# Patient Record
Sex: Male | Born: 1945 | Race: White | Hispanic: No | Smoking: Never smoker
Health system: Southern US, Community
[De-identification: ages and names within clinical notes are randomized; demographics above are authoritative.]

## PROBLEM LIST (undated history)

## (undated) DIAGNOSIS — I2510 Atherosclerotic heart disease of native coronary artery without angina pectoris: Secondary | ICD-10-CM

---

## 2017-01-02 ENCOUNTER — Emergency Department (HOSPITAL_COMMUNITY): Payer: Medicare HMO

## 2017-01-02 ENCOUNTER — Inpatient Hospital Stay (HOSPITAL_COMMUNITY)
Admission: EM | Admit: 2017-01-02 | Discharge: 2017-01-15 | DRG: 228 | Disposition: A | Discharge status: Home / Self Care | Payer: Medicare HMO | Attending: Surgery | Admitting: Surgery

## 2017-01-02 ENCOUNTER — Encounter (HOSPITAL_COMMUNITY): Payer: Self-pay | Admitting: Family Medicine

## 2017-01-02 ENCOUNTER — Other Ambulatory Visit: Payer: Self-pay

## 2017-01-02 DIAGNOSIS — K254 Chronic or unspecified gastric ulcer with hemorrhage: Secondary | ICD-10-CM | POA: Diagnosis not present

## 2017-01-02 DIAGNOSIS — R55 Syncope and collapse: Secondary | ICD-10-CM | POA: Diagnosis present

## 2017-01-02 DIAGNOSIS — Z7902 Long term (current) use of antithrombotics/antiplatelets: Secondary | ICD-10-CM

## 2017-01-02 DIAGNOSIS — D72829 Elevated white blood cell count, unspecified: Secondary | ICD-10-CM | POA: Diagnosis present

## 2017-01-02 DIAGNOSIS — E785 Hyperlipidemia, unspecified: Secondary | ICD-10-CM | POA: Diagnosis present

## 2017-01-02 DIAGNOSIS — E860 Dehydration: Secondary | ICD-10-CM | POA: Diagnosis present

## 2017-01-02 DIAGNOSIS — I5030 Unspecified diastolic (congestive) heart failure: Secondary | ICD-10-CM | POA: Diagnosis not present

## 2017-01-02 DIAGNOSIS — K5730 Diverticulosis of large intestine without perforation or abscess without bleeding: Secondary | ICD-10-CM | POA: Diagnosis present

## 2017-01-02 DIAGNOSIS — I214 Non-ST elevation (NSTEMI) myocardial infarction: Secondary | ICD-10-CM | POA: Diagnosis not present

## 2017-01-02 DIAGNOSIS — I2510 Atherosclerotic heart disease of native coronary artery without angina pectoris: Secondary | ICD-10-CM | POA: Diagnosis present

## 2017-01-02 DIAGNOSIS — I951 Orthostatic hypotension: Secondary | ICD-10-CM | POA: Diagnosis present

## 2017-01-02 DIAGNOSIS — N179 Acute kidney failure, unspecified: Secondary | ICD-10-CM | POA: Diagnosis present

## 2017-01-02 DIAGNOSIS — Z955 Presence of coronary angioplasty implant and graft: Secondary | ICD-10-CM

## 2017-01-02 DIAGNOSIS — Z79899 Other long term (current) drug therapy: Secondary | ICD-10-CM | POA: Diagnosis not present

## 2017-01-02 DIAGNOSIS — I480 Paroxysmal atrial fibrillation: Secondary | ICD-10-CM | POA: Diagnosis not present

## 2017-01-02 DIAGNOSIS — Z7901 Long term (current) use of anticoagulants: Secondary | ICD-10-CM

## 2017-01-02 DIAGNOSIS — E8770 Fluid overload, unspecified: Secondary | ICD-10-CM | POA: Diagnosis not present

## 2017-01-02 DIAGNOSIS — K648 Other hemorrhoids: Secondary | ICD-10-CM | POA: Diagnosis present

## 2017-01-02 DIAGNOSIS — J9811 Atelectasis: Secondary | ICD-10-CM | POA: Diagnosis not present

## 2017-01-02 DIAGNOSIS — E876 Hypokalemia: Secondary | ICD-10-CM | POA: Diagnosis present

## 2017-01-02 DIAGNOSIS — I472 Ventricular tachycardia: Secondary | ICD-10-CM | POA: Diagnosis not present

## 2017-01-02 DIAGNOSIS — I452 Bifascicular block: Secondary | ICD-10-CM | POA: Diagnosis present

## 2017-01-02 DIAGNOSIS — Z7982 Long term (current) use of aspirin: Secondary | ICD-10-CM

## 2017-01-02 DIAGNOSIS — I252 Old myocardial infarction: Secondary | ICD-10-CM

## 2017-01-02 DIAGNOSIS — Z96652 Presence of left artificial knee joint: Secondary | ICD-10-CM | POA: Diagnosis present

## 2017-01-02 DIAGNOSIS — Z8249 Family history of ischemic heart disease and other diseases of the circulatory system: Secondary | ICD-10-CM

## 2017-01-02 DIAGNOSIS — I10 Essential (primary) hypertension: Secondary | ICD-10-CM | POA: Diagnosis present

## 2017-01-02 DIAGNOSIS — D62 Acute posthemorrhagic anemia: Secondary | ICD-10-CM | POA: Diagnosis not present

## 2017-01-02 DIAGNOSIS — I4891 Unspecified atrial fibrillation: Secondary | ICD-10-CM

## 2017-01-02 DIAGNOSIS — Z01810 Encounter for preprocedural cardiovascular examination: Secondary | ICD-10-CM | POA: Diagnosis not present

## 2017-01-02 DIAGNOSIS — W1839XA Other fall on same level, initial encounter: Secondary | ICD-10-CM | POA: Diagnosis present

## 2017-01-02 DIAGNOSIS — Z951 Presence of aortocoronary bypass graft: Secondary | ICD-10-CM

## 2017-01-02 HISTORY — DX: Atherosclerotic heart disease of native coronary artery without angina pectoris: I25.10

## 2017-01-02 HISTORY — PX: CORONARY ANGIOPLASTY: SHX604

## 2017-01-02 LAB — BASIC METABOLIC PANEL
ANION GAP: 4 — AB (ref 5–15)
Anion gap: 11 (ref 5–15)
BUN: 82 mg/dL — AB (ref 6–20)
BUN: 75 mg/dL — ABNORMAL HIGH (ref 6–20)
CALCIUM: 8.3 mg/dL — AB (ref 8.9–10.3)
CHLORIDE: 106 mmol/L (ref 101–111)
CO2: 16 mmol/L — ABNORMAL LOW (ref 22–32)
CO2: 21 mmol/L — ABNORMAL LOW (ref 22–32)
CREATININE: 1.24 mg/dL (ref 0.61–1.24)
CREATININE: 1.11 mg/dL (ref 0.61–1.24)
Calcium: 9 mg/dL (ref 8.9–10.3)
Chloride: 113 mmol/L — ABNORMAL HIGH (ref 101–111)
GFR calc non Af Amer: >60 mL/min (ref >60)
GFR, EST NON AFRICAN AMERICAN: 56 mL/min — AB (ref >60)
Glucose, Bld: 122 mg/dL — ABNORMAL HIGH (ref 65–99)
Glucose, Bld: 106 mg/dL — ABNORMAL HIGH (ref 65–99)
Potassium: 4.5 mmol/L (ref 3.5–5.1)
Potassium: 4.1 mmol/L (ref 3.5–5.1)
SODIUM: 133 mmol/L — AB (ref 135–145)
SODIUM: 138 mmol/L (ref 135–145)

## 2017-01-02 LAB — TROPONIN I
TROPONIN I: 0.49 ng/mL — AB (ref <0.03)
Troponin I: 0.52 ng/mL (ref <0.03)

## 2017-01-02 LAB — URINALYSIS, ROUTINE W REFLEX MICROSCOPIC
BACTERIA UA: NONE SEEN
Bilirubin Urine: NEGATIVE
Glucose, UA: NEGATIVE mg/dL
Ketones, ur: NEGATIVE mg/dL
LEUKOCYTES UA: NEGATIVE
Nitrite: NEGATIVE
PROTEIN: NEGATIVE mg/dL
SPECIFIC GRAVITY, URINE: 1.019 (ref 1.005–1.030)
SQUAMOUS EPITHELIAL / LPF: NONE SEEN
pH: 5 (ref 5.0–8.0)

## 2017-01-02 LAB — I-STAT TROPONIN, ED: TROPONIN I, POC: 0.05 ng/mL (ref 0.00–0.08)

## 2017-01-02 LAB — PROTIME-INR
INR: 1.08
PROTHROMBIN TIME: 13.9 s (ref 11.4–15.2)

## 2017-01-02 LAB — CBC
HCT: 39 % (ref 39.0–52.0)
Hemoglobin: 13.3 g/dL (ref 13.0–17.0)
MCH: 35.1 pg — ABNORMAL HIGH (ref 26.0–34.0)
MCHC: 34.1 g/dL (ref 30.0–36.0)
MCV: 102.9 fL — AB (ref 78.0–100.0)
PLATELETS: 211 10*3/uL (ref 150–400)
RBC: 3.79 MIL/uL — AB (ref 4.22–5.81)
RDW: 14 % (ref 11.5–15.5)
WBC: 19.2 10*3/uL — AB (ref 4.0–10.5)

## 2017-01-02 LAB — TSH: TSH: 0.412 u[IU]/mL (ref 0.350–4.500)

## 2017-01-02 LAB — MAGNESIUM: Magnesium: 1.5 mg/dL — ABNORMAL LOW (ref 1.7–2.4)

## 2017-01-02 MED ORDER — ONDANSETRON HCL 4 MG/2ML IJ SOLN
4.0000 mg | Freq: Four times a day (QID) | INTRAMUSCULAR | Status: DC | PRN
  Administered 2017-01-04: 4 mg via INTRAVENOUS
  Filled 2017-01-02 (×2): qty 2

## 2017-01-02 MED ORDER — SODIUM CHLORIDE 0.9% FLUSH: 3.0000 mL | INTRAVENOUS | Status: DC | PRN

## 2017-01-02 MED ORDER — CLOPIDOGREL BISULFATE 75 MG PO TABS: 75.0000 mg | ORAL_TABLET | Freq: Every day | ORAL | Status: DC

## 2017-01-02 MED ORDER — ONDANSETRON HCL 4 MG PO TABS: 4.0000 mg | ORAL_TABLET | Freq: Four times a day (QID) | ORAL | Status: DC | PRN

## 2017-01-02 MED ORDER — DILTIAZEM HCL 30 MG PO TABS
30.0000 mg | ORAL_TABLET | Freq: Four times a day (QID) | ORAL | Status: DC
  Filled 2017-01-02 (×2): qty 1

## 2017-01-02 MED ORDER — ALUM & MAG HYDROXIDE-SIMETH 200-200-20 MG/5ML PO SUSP
30.0000 mL | Freq: Four times a day (QID) | ORAL | Status: DC | PRN
  Administered 2017-01-02: 30 mL via ORAL
  Filled 2017-01-02: qty 30

## 2017-01-02 MED ORDER — SODIUM CHLORIDE 0.9 % IV BOLUS (SEPSIS)
1000.0000 mL | Freq: Once | INTRAVENOUS | Status: AC
  Administered 2017-01-02: 1000 mL via INTRAVENOUS

## 2017-01-02 MED ORDER — SODIUM CHLORIDE 0.9 % IV SOLN: INTRAVENOUS | Status: DC

## 2017-01-02 MED ORDER — HEPARIN SODIUM (PORCINE) 5000 UNIT/ML IJ SOLN: 5000.0000 [IU] | Freq: Three times a day (TID) | INTRAMUSCULAR | Status: DC

## 2017-01-02 MED ORDER — METOPROLOL TARTRATE 50 MG PO TABS: 50.0000 mg | ORAL_TABLET | Freq: Two times a day (BID) | ORAL | Status: DC

## 2017-01-02 MED ORDER — ACETAMINOPHEN 650 MG RE SUPP
650.0000 mg | Freq: Four times a day (QID) | RECTAL | Status: DC | PRN
Start: 2017-01-02 — End: 2017-01-11

## 2017-01-02 MED ORDER — ATORVASTATIN CALCIUM 20 MG PO TABS
20.0000 mg | ORAL_TABLET | Freq: Every day | ORAL | Status: DC
  Administered 2017-01-03 – 2017-01-04 (×2): 20 mg via ORAL
  Filled 2017-01-02 (×2): qty 1

## 2017-01-02 MED ORDER — OFF THE BEAT BOOK
Freq: Once | Status: AC
  Administered 2017-01-02: 16:00:00
  Filled 2017-01-02: qty 1

## 2017-01-02 MED ORDER — ALBUTEROL SULFATE (2.5 MG/3ML) 0.083% IN NEBU: 2.5000 mg | INHALATION_SOLUTION | RESPIRATORY_TRACT | Status: DC | PRN

## 2017-01-02 MED ORDER — SODIUM CHLORIDE 0.9% FLUSH
3.0000 mL | Freq: Two times a day (BID) | INTRAVENOUS | Status: DC
  Administered 2017-01-02 – 2017-01-10 (×13): 3 mL via INTRAVENOUS

## 2017-01-02 MED ORDER — APIXABAN 5 MG PO TABS
5.0000 mg | ORAL_TABLET | Freq: Two times a day (BID) | ORAL | Status: DC
  Administered 2017-01-02: 5 mg via ORAL
  Filled 2017-01-02 (×2): qty 1

## 2017-01-02 MED ORDER — POLYETHYLENE GLYCOL 3350 17 G PO PACK: 17.0000 g | PACK | Freq: Every day | ORAL | Status: DC | PRN

## 2017-01-02 MED ORDER — SENNA 8.6 MG PO TABS
1.0000 | ORAL_TABLET | Freq: Two times a day (BID) | ORAL | Status: DC
  Administered 2017-01-02 – 2017-01-10 (×9): 8.6 mg via ORAL
  Filled 2017-01-02 (×14): qty 1

## 2017-01-02 MED ORDER — DILTIAZEM LOAD VIA INFUSION
10.0000 mg | Freq: Once | INTRAVENOUS | Status: DC
  Filled 2017-01-02 (×2): qty 10

## 2017-01-02 MED ORDER — DILTIAZEM HCL 25 MG/5ML IV SOLN
10.0000 mg | INTRAVENOUS | Status: AC
  Administered 2017-01-02: 10 mg via INTRAVENOUS
  Filled 2017-01-02: qty 5

## 2017-01-02 MED ORDER — ESCITALOPRAM OXALATE 10 MG PO TABS
10.0000 mg | ORAL_TABLET | Freq: Every day | ORAL | Status: DC
  Administered 2017-01-03 – 2017-01-15 (×12): 10 mg via ORAL
  Filled 2017-01-02 (×12): qty 1

## 2017-01-02 MED ORDER — ASPIRIN EC 81 MG PO TBEC
81.0000 mg | DELAYED_RELEASE_TABLET | Freq: Every day | ORAL | Status: DC
  Administered 2017-01-03: 81 mg via ORAL
  Filled 2017-01-02: qty 1

## 2017-01-02 MED ORDER — SODIUM CHLORIDE 0.9 % IV SOLN
250.0000 mL | INTRAVENOUS | Status: DC | PRN
  Administered 2017-01-05: 12:00:00 via INTRAVENOUS

## 2017-01-02 MED ORDER — AMIODARONE HCL 200 MG PO TABS
400.0000 mg | ORAL_TABLET | Freq: Two times a day (BID) | ORAL | Status: DC
  Administered 2017-01-02 – 2017-01-10 (×18): 400 mg via ORAL
  Filled 2017-01-02 (×18): qty 2

## 2017-01-02 MED ORDER — ACETAMINOPHEN 325 MG PO TABS
650.0000 mg | ORAL_TABLET | Freq: Four times a day (QID) | ORAL | Status: DC | PRN
  Administered 2017-01-03: 650 mg via ORAL
  Filled 2017-01-02: qty 2

## 2017-01-02 MED ORDER — TRAZODONE HCL 150 MG PO TABS: 75.0000 mg | ORAL_TABLET | Freq: Every day | ORAL | Status: DC | PRN

## 2017-01-02 MED ORDER — TRAZODONE HCL 50 MG PO TABS: 50.0000 mg | ORAL_TABLET | Freq: Every evening | ORAL | Status: DC | PRN

## 2017-01-02 NOTE — ED Triage Notes (Signed)
Pt had 2 episodes of syncope last night-- on thinners, heart rate 190's, hx MI in 2015

## 2017-01-02 NOTE — Progress Notes (Signed)
CRITICAL VALUE ALERT  Critical Value:  Troponin 0.49  Date & Time Notied:  01/02/17 at 1815   Provider Notified: Dr. Cristal Deerhristopher  Orders Received/Actions taken: continue to monitor

## 2017-01-02 NOTE — ED Provider Notes (Signed)
[facility] Provider Note   CSN: [id number] Arrival date & time: 01/02/17  [id number]     History   Chief Complaint Chief Complaint  Patient presents with  . Loss of Consciousness  . Chest Pain    HPI Stephen Crawford is a 71 y.o. male with history of CAD status post MI and stents, hypertension, hyperlipidemia presents to the ED for evaluation of central chest tightness, constant, nonradiating since last night. Associated symptoms include palpitations, indigestion, dizziness leading to two syncopal episodes. Reports dizziness followed by syncope with collapse, he hit his right cheek on nearby bathroom sink the first time. Second syncopal episode was witnessed by roommate who caught him before he hit the ground. He is on anticoagulant and blood pressure medications. Cardiologist is Dr. Clinton QuantKamdar. Last heart cath 2015, echo 2015 LVEF >55%. No history of a-fib.  No fevers, chills, cough, SOB, nausea, vomiting, abdominal pain, back pain, LE swelling, orthopnea. No recent illnesses.  HPI  No past medical history on file.  There are no active problems to display for this patient.   No past surgical history on file.     Home Medications    Prior to Admission medications   Medication Sig Start Date End Date Taking? Authorizing Provider  amLODipine (NORVASC) 5 MG tablet Take 5 mg daily by mouth.   Yes [provider]  aspirin EC 81 MG tablet Take 81 mg daily by mouth.   Yes [provider]  atorvastatin (LIPITOR) 20 MG tablet Take 20 mg daily by mouth.   Yes [provider]  clopidogrel (PLAVIX) 75 MG tablet Take 75 mg daily by mouth.   Yes [provider]  escitalopram (LEXAPRO) 10 MG tablet Take 10 mg daily by mouth.   Yes [provider]  meloxicam (MOBIC) 7.5 MG tablet Take 7.5 mg daily by mouth.   Yes [provider]  metoprolol tartrate (LOPRESSOR) 25 MG tablet Take 25 mg 2 (two) times daily by  mouth.   Yes [provider]  traZODone (DESYREL) 150 MG tablet Take 75 mg daily as needed by mouth for sleep.    Yes [provider]    Family History No family history on file.  Social History Social History   Tobacco Use  . Smoking status: Not on file  Substance Use Topics  . Alcohol use: Not on file  . Drug use: Not on file     Allergies   Patient has no allergy information on record.   Review of Systems Review of Systems  Cardiovascular: Positive for chest pain and palpitations.  Neurological: Positive for dizziness, syncope and light-headedness.  All other systems reviewed and are negative.    Physical Exam Updated Vital Signs BP 117/68   Pulse (!) 108   Temp 98.2 F (36.8 C) (Oral)   Resp 15   Ht 5\' 9"  (1.753 m)   Wt 80.3 kg (177 lb)   SpO2 98%   BMI 26.14 kg/m   Physical Exam  Constitutional: He appears well-developed and well-nourished.  NAD.  HENT:  Head: Normocephalic.  Nose: Nose normal.  +Ecchymosis and tenderness to right cheek No scalp, nasal, facial tenderness Moist mucous membranes Tonsils and oropharynx normal  Eyes: Conjunctivae and lids are normal.  EOMs and PERRL intact bilaterally No nystagmus  Neck: Trachea normal and normal range of motion.  No midline c spine tenderness Painless PROM of neck  Trachea midline   Cardiovascular: Normal rate, S1 normal, S2 normal  and normal heart sounds. An irregularly irregular rhythm present.  Pulses:      Carotid pulses are 2+ on the right side, and 2+ on the left side.      Radial pulses are 2+ on the right side, and 2+ on the left side.       Dorsalis pedis pulses are 2+ on the right side, and 2+ on the left side.  Irregularly irregular, HR 180s No S3 No LE edema  Pulmonary/Chest: Effort normal and breath sounds normal. No respiratory distress. He has no decreased breath sounds. He has no wheezes. He has no rhonchi. He has no rales.  No chest wall tenderness CP not  reproducible with AROM of upper extremities  Abdominal: Soft. Bowel sounds are normal. There is no tenderness.  No epigastric tenderness  Musculoskeletal:  PROM of UE and LE painless   Lymphadenopathy:    He has no cervical adenopathy.  Neurological: He is alert. GCS eye subscore is 4. GCS verbal subscore is 5. GCS motor subscore is 6.  No dysarthria.  Strength 5/5 with hand grip and ankle flexion/extension.   Sensation to light touch intact in hands and feet. CN I and VIII not tested. CN II-XII intact bilaterally.   Skin: Skin is warm and dry. Capillary refill takes less than 2 seconds.  Psychiatric: He has a normal mood and affect. His speech is normal and behavior is normal. Judgment and thought content normal. Cognition and memory are normal.     ED Treatments / Results  Labs (all labs ordered are listed, but only abnormal results are displayed) Labs Reviewed  BASIC METABOLIC PANEL - Abnormal; Notable for the following components:      Result Value   Sodium 133 (*)    CO2 16 (*)    Glucose, Bld 122 (*)    BUN 82 (*)    GFR calc non Af Amer 56 (*)    All other components within normal limits  CBC - Abnormal; Notable for the following components:   WBC 19.2 (*)    RBC 3.79 (*)    MCV 102.9 (*)    MCH 35.1 (*)    All other components within normal limits  URINALYSIS, ROUTINE W REFLEX MICROSCOPIC - Abnormal; Notable for the following components:   Hgb urine dipstick SMALL (*)    All other components within normal limits  PROTIME-INR  MAGNESIUM  TSH  I-STAT TROPONIN, ED    EKG  EKG Interpretation  Date/Time:  Saturday January 02 2017 10:32:18 EST Ventricular Rate:  107 PR Interval:    QRS Duration: 122 QT Interval:  341 QTC Calculation: 455 R Axis:   101 Text Interpretation:  Sinus tachycardia RBBB and LPFB Inferior infarct, old Lateral leads are also involved Confirmed by Rolan Bucco [phone]) on 01/02/2017 10:41:28 AM       Radiology Ct Head Wo  Contrast  Result Date: 01/02/2017 CLINICAL DATA:  Dizziness. Fall. Struck right side of face on that test. It has tried ight bruising. EXAM: CT HEAD WITHOUT CONTRAST CT MAXILLOFACIAL WITHOUT CONTRAST CT CERVICAL SPINE WITHOUT CONTRAST TECHNIQUE: Multidetector CT imaging of the head, cervical spine, and maxillofacial structures were performed using the standard protocol without intravenous contrast. Multiplanar CT image reconstructions of the cervical spine and maxillofacial structures were also generated. COMPARISON:  None. FINDINGS: CT HEAD FINDINGS Brain: Global atrophy. No mass effect, midline shift, or acute hemorrhage. No focal acute infarct. Vascular: No hyperdense vessel or unexpected calcification. Skull: The cranium is intact. Other:  Noncontributory CT MAXILLOFACIAL FINDINGS Osseous: No acute fracture. No dislocation. No destructive bone lesion. Nasal septum deviated to the right. Orbits: No evidence of vitreous hemorrhage or orbital hemorrhage. Sinuses: Is visualized paranasal sinuses are clear. Mastoid air cells clear. Soft tissues: Airways patent. No obvious soft tissue hematoma. No obvious mass. Mild soft tissue swelling over the lateral orbital rim. It is this correlates with the patient's bruising. CT CERVICAL SPINE FINDINGS Alignment: There is slight anterolisthesis C7 upon T1 related to facet arthropathy. Otherwise anatomic alignment is preserved. Skull base and vertebrae: Mild wedging of C7 and T1 has a chronic appearance. No obvious acute fracture lines are visualized. Soft tissues and spinal canal: No obvious spinal hematoma. No obvious soft tissue hemorrhage. Thyroid is heterogeneous. 1.3 cm left thyroid hypodensity is noted. No obvious abnormal adenopathy. No evidence of aneurysm. Atherosclerotic calcifications in the bilateral carotid bulbs left greater than right. Disc levels:  C2-3 minimal central bulge and disc osteophyte C3-4: Severe disc narrowing with posterior osteophytes, which  reached cord. Severe left foraminal narrowing. Left facet arthropathy is greater than right facet arthropathy C4-5: Posterior osteophytic ridging. Central disc herniation reaches the cord. Severe right foraminal narrowing due to uncovertebral osteophytes. C5-6: Posterior osteophytic ridging, asymmetric to the left reaches the cord. Severe left foraminal narrowing due to uncovertebral osteophytes. C6-7:  Minimal posterior osteophytic ridging.  Foramina patent. C7-T1: On covering of the disc. Grossly patent. Significant bilateral facet arthropathy. Upper thoracic spine discs are unremarkable. Upper chest: No acute abnormality. Other: Noncontributory. IMPRESSION: Head: No acute intracranial pathology.  Atrophy is noted. Face: No acute bony pathology. Mild soft tissue swelling over the lateral right orbit. Cervical spine: No acute bony injury. Advanced degenerative changes as described. Electronically Signed   By: Jolaine ClickArthur  Hoss M.D.   On: 01/02/2017 10:13   Ct Cervical Spine Wo Contrast  Result Date: 01/02/2017 CLINICAL DATA:  Dizziness. Fall. Struck right side of face on that test. It has tried ight bruising. EXAM: CT HEAD WITHOUT CONTRAST CT MAXILLOFACIAL WITHOUT CONTRAST CT CERVICAL SPINE WITHOUT CONTRAST TECHNIQUE: Multidetector CT imaging of the head, cervical spine, and maxillofacial structures were performed using the standard protocol without intravenous contrast. Multiplanar CT image reconstructions of the cervical spine and maxillofacial structures were also generated. COMPARISON:  None. FINDINGS: CT HEAD FINDINGS Brain: Global atrophy. No mass effect, midline shift, or acute hemorrhage. No focal acute infarct. Vascular: No hyperdense vessel or unexpected calcification. Skull: The cranium is intact. Other: Noncontributory CT MAXILLOFACIAL FINDINGS Osseous: No acute fracture. No dislocation. No destructive bone lesion. Nasal septum deviated to the right. Orbits: No evidence of vitreous hemorrhage or orbital  hemorrhage. Sinuses: Is visualized paranasal sinuses are clear. Mastoid air cells clear. Soft tissues: Airways patent. No obvious soft tissue hematoma. No obvious mass. Mild soft tissue swelling over the lateral orbital rim. It is this correlates with the patient's bruising. CT CERVICAL SPINE FINDINGS Alignment: There is slight anterolisthesis C7 upon T1 related to facet arthropathy. Otherwise anatomic alignment is preserved. Skull base and vertebrae: Mild wedging of C7 and T1 has a chronic appearance. No obvious acute fracture lines are visualized. Soft tissues and spinal canal: No obvious spinal hematoma. No obvious soft tissue hemorrhage. Thyroid is heterogeneous. 1.3 cm left thyroid hypodensity is noted. No obvious abnormal adenopathy. No evidence of aneurysm. Atherosclerotic calcifications in the bilateral carotid bulbs left greater than right. Disc levels:  C2-3 minimal central bulge and disc osteophyte C3-4: Severe disc narrowing with posterior osteophytes, which reached cord. Severe left foraminal narrowing.  Left facet arthropathy is greater than right facet arthropathy C4-5: Posterior osteophytic ridging. Central disc herniation reaches the cord. Severe right foraminal narrowing due to uncovertebral osteophytes. C5-6: Posterior osteophytic ridging, asymmetric to the left reaches the cord. Severe left foraminal narrowing due to uncovertebral osteophytes. C6-7:  Minimal posterior osteophytic ridging.  Foramina patent. C7-T1: On covering of the disc. Grossly patent. Significant bilateral facet arthropathy. Upper thoracic spine discs are unremarkable. Upper chest: No acute abnormality. Other: Noncontributory. IMPRESSION: Head: No acute intracranial pathology.  Atrophy is noted. Face: No acute bony pathology. Mild soft tissue swelling over the lateral right orbit. Cervical spine: No acute bony injury. Advanced degenerative changes as described. Electronically Signed   By: Jolaine Click M.D.   On: 01/02/2017 10:13    Dg Chest Portable 1 View  Result Date: 01/02/2017 CLINICAL DATA:  Chest pain with shortness of breath EXAM: PORTABLE CHEST 1 VIEW COMPARISON:  None. FINDINGS: There is no edema or consolidation. The heart size and pulmonary vascularity are normal. No adenopathy. There is aortic atherosclerosis. No pneumothorax. There is degenerative change in the right shoulder and in the thoracic spine. IMPRESSION: Aortic atherosclerosis.  No edema or consolidation. Aortic Atherosclerosis (ICD10-I70.0). Electronically Signed   By: Bretta Bang III M.D.   On: 01/02/2017 08:49   Ct Maxillofacial Wo Contrast  Result Date: 01/02/2017 CLINICAL DATA:  Dizziness. Fall. Struck right side of face on that test. It has tried ight bruising. EXAM: CT HEAD WITHOUT CONTRAST CT MAXILLOFACIAL WITHOUT CONTRAST CT CERVICAL SPINE WITHOUT CONTRAST TECHNIQUE: Multidetector CT imaging of the head, cervical spine, and maxillofacial structures were performed using the standard protocol without intravenous contrast. Multiplanar CT image reconstructions of the cervical spine and maxillofacial structures were also generated. COMPARISON:  None. FINDINGS: CT HEAD FINDINGS Brain: Global atrophy. No mass effect, midline shift, or acute hemorrhage. No focal acute infarct. Vascular: No hyperdense vessel or unexpected calcification. Skull: The cranium is intact. Other: Noncontributory CT MAXILLOFACIAL FINDINGS Osseous: No acute fracture. No dislocation. No destructive bone lesion. Nasal septum deviated to the right. Orbits: No evidence of vitreous hemorrhage or orbital hemorrhage. Sinuses: Is visualized paranasal sinuses are clear. Mastoid air cells clear. Soft tissues: Airways patent. No obvious soft tissue hematoma. No obvious mass. Mild soft tissue swelling over the lateral orbital rim. It is this correlates with the patient's bruising. CT CERVICAL SPINE FINDINGS Alignment: There is slight anterolisthesis C7 upon T1 related to facet arthropathy.  Otherwise anatomic alignment is preserved. Skull base and vertebrae: Mild wedging of C7 and T1 has a chronic appearance. No obvious acute fracture lines are visualized. Soft tissues and spinal canal: No obvious spinal hematoma. No obvious soft tissue hemorrhage. Thyroid is heterogeneous. 1.3 cm left thyroid hypodensity is noted. No obvious abnormal adenopathy. No evidence of aneurysm. Atherosclerotic calcifications in the bilateral carotid bulbs left greater than right. Disc levels:  C2-3 minimal central bulge and disc osteophyte C3-4: Severe disc narrowing with posterior osteophytes, which reached cord. Severe left foraminal narrowing. Left facet arthropathy is greater than right facet arthropathy C4-5: Posterior osteophytic ridging. Central disc herniation reaches the cord. Severe right foraminal narrowing due to uncovertebral osteophytes. C5-6: Posterior osteophytic ridging, asymmetric to the left reaches the cord. Severe left foraminal narrowing due to uncovertebral osteophytes. C6-7:  Minimal posterior osteophytic ridging.  Foramina patent. C7-T1: On covering of the disc. Grossly patent. Significant bilateral facet arthropathy. Upper thoracic spine discs are unremarkable. Upper chest: No acute abnormality. Other: Noncontributory. IMPRESSION: Head: No acute intracranial pathology.  Atrophy is  noted. Face: No acute bony pathology. Mild soft tissue swelling over the lateral right orbit. Cervical spine: No acute bony injury. Advanced degenerative changes as described. Electronically Signed   By: Jolaine Click M.D.   On: 01/02/2017 10:13    Procedures Procedures (including critical care time)  Medications Ordered in ED Medications  diltiazem (CARDIZEM) injection 10 mg (10 mg Intravenous Given 01/02/17 0925)     Initial Impression / Assessment and Plan / ED Course  I have reviewed the triage vital signs and the nursing notes.  Pertinent labs & imaging results that were available during my care of the  patient were reviewed by me and considered in my medical decision making (see chart for details).  Clinical Course as of Jan 03 1131  Sat Jan 02, 2017  0859 WBC: (!) 19.2 [CG]  1015 Atrial fibrillation with rapid ventricular response Right bundle branch block Possible Inferior infarct , age undetermined Abnormal ECG No old tracing to compare Confirmed by Rolan Bucco [id number]) on 01/02/2017 8:23:15 AM Also confirmed by Rolan Bucco [phone]), editor Madalyn Rob [phone]) on 01/02/2017 9:02:43 AM ED EKG within 10 minutes [CG]  1035 Re-evaluated pt. He denies Cp, palpitations, SOB, nausea, light-headedness. EKG in sinus tachycardia.  [CG]  1057 Discussed pt with Dr Wyline Mood (cardiology) recommends admission to medicine team given elevated WBC and persistent tachycardia. Hospitalist consult pending.  [CG]    Clinical Course User Index [CG] Liberty Handy, PA-C   71 yo male presents to ED for chest tightness, palpitations, dizziness leading to two syncopal episodes. Onset last night. On exam, HR in 180-190s, SBP >110. No hypoxia or tachypnea. A-fib with RVR on initial EKG. No previous h/o arrhythmias. He is on ASA and plavix. H/o CAD s/p STEMI and stents, HTN, HLD.Followed by Dr. Clinton Quant in [place]. He has ecchymosis to R shoulder and zygomatic bone, but PROM of UE and LE painless. No neuro deficits on exam. CT head, neck, maxillofacial negative.  Lab work remarkable for leukocytosis WBC 19.2, but no fever, chills or s/s suggestive of infectious process. CXR and U/A w/o evidence of infection.  Normal electrolytes and creatinine. Trop x 1 normal.   Final Clinical Impressions(s) / ED Diagnoses   Pt received 10 mg cardizem with adequate decrease of HR, now in low 100s range. Re-evaluated pt, he is asymptomatic. VS WNL and stable after cardizem. EKG now with sinus tachycardia. Given age, syncopal episodes, symptomatology and new onset atrial fibrillation with RVR will consult cardiology for  admission.   Cardiology recommends admission to medicine team, spoke to Dr. Mariea Clonts who will admit.  Final diagnoses:  Paroxysmal atrial fibrillation with RVR [facility])    ED Discharge Orders    None       Liberty Handy, PA-C 01/02/17 1132    Rolan Bucco, MD 01/02/17 1209

## 2017-01-02 NOTE — Consult Note (Addendum)
ELECTROPHYSIOLOGY CONSULT NOTE  Patient ID: Stephen Crawford, MRN: [id number], DOB/AGE: [birth year] 71 y.o. Admit date: 01/02/2017 Date of Consult: 01/02/2017  Primary Physician: System, Pcp Not In Primary Cardiologist: Dedric Ethington is a 71 y.o. male who is being seen today for the evaluation of Afib  at the request of Drt Courage.   Chief Complaint: Atrial Fib and syncope   HPI Stephen Crawford is a 71 y.o. male  Admitted following 2 syncopal episodes.  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  Yesterday evening he developed tachypalpitations.   This was associated with nausea shortness of breath and some lightheadedness.  Last night x2 following urination, he became profoundly lightheaded and syncopal.Unfortunately this occurred in the bathroom.  Even more unfortunately, his head is softer than the porcelain.  He ended up with a bruise in the back of his head ecchymosis on his face.  He was seen in the emergency room where his heart rate was noted to be 180.  He was given a Cardizem drip and sinus rhythm emerged.  His palpitations have abated.  He has had no prior syncope.  He has no interval tachypalpitations since his MI.  Edema of days related to dietary indiscretion.  No other changes in medications.  Denies chest pain. He has a history of ischemic heart disease.  He is status post LAD PCI 2015.  DES x2.  2015 PCI OM  Echocardiogram 8/15 LVH with normal LV function   Past Medical History:  Diagnosis Date  . CAD (coronary artery disease) 01/02/2017      Surgical History:  Past Surgical History:  Procedure Laterality Date  . CORONARY ANGIOPLASTY N/A 01/02/2017     Home Meds: Prior to Admission medications   Medication Sig Start Date End Date Taking? Authorizing Provider  amLODipine (NORVASC) 5 MG tablet Take 5 mg daily by mouth.   Yes [provider]  aspirin EC 81 MG tablet Take 81 mg daily by mouth.   Yes  [provider]  atorvastatin (LIPITOR) 20 MG tablet Take 20 mg daily by mouth.   Yes [provider]  clopidogrel (PLAVIX) 75 MG tablet Take 75 mg daily by mouth.   Yes [provider]  escitalopram (LEXAPRO) 10 MG tablet Take 10 mg daily by mouth.   Yes [provider]  meloxicam (MOBIC) 7.5 MG tablet Take 7.5 mg daily by mouth.   Yes [provider]  metoprolol tartrate (LOPRESSOR) 25 MG tablet Take 25 mg 2 (two) times daily by mouth.   Yes [provider]  traZODone (DESYREL) 150 MG tablet Take 75 mg daily as needed by mouth for sleep.    Yes [provider]    Inpatient Medications:  . [START ON 01/03/2017] aspirin EC  81 mg Oral Daily  . [START ON 01/03/2017] atorvastatin  20 mg Oral Daily  . [START ON 01/03/2017] clopidogrel  75 mg Oral Daily  . diltiazem  30 mg Oral Q6H  . [START ON 01/03/2017] escitalopram  10 mg Oral Daily  . heparin  5,000 Units Subcutaneous Q8H  . metoprolol tartrate  50 mg Oral BID  . senna  1 tablet Oral BID  . sodium chloride flush  3 mL Intravenous Q12H      Allergies: Not on File  Social History   Socioeconomic History  . Marital status: Widowed    Spouse name: Not on file  . Number of children: Not on file  .  Years of education: Not on file  . Highest education level: Not on file  Social Needs  . Financial resource strain: Not on file  . Food insecurity - worry: Not on file  . Food insecurity - inability: Not on file  . Transportation needs - medical: Not on file  . Transportation needs - non-medical: Not on file  Occupational History  . Not on file  Tobacco Use  . Smoking status: Unknown If Ever Smoked  . Tobacco comment: not currently smoking  Substance and Sexual Activity  . Alcohol use: No    Frequency: Never  . Drug use: No  . Sexual activity: Not on file  Other Topics Concern  . Not on file  Social History Narrative  . Not on file     Family History  Problem  Relation Age of Onset  . Hypertension Father      ROS:  Please see the history of present illness.     All other systems reviewed and negative.    Physical Exam: Blood pressure 110/73, pulse 86, temperature 98.2 F (36.8 C), temperature source Oral, resp. rate (!) 22, height 5\' 9"  (1.753 m), weight 177 lb (80.3 kg), SpO2 100 %. General: Well developed, well nourished male in no acute distress. Head: Normocephalic, right orbital ecchymosis sclera non-icteric, no xanthomas, nares are without discharge. EENT: normal Lymph Nodes:  none Back: without scoliosis/kyphosis, no CVA tendersness Neck: Negative for carotid bruits. JVD not elevated. Lungs: Clear bilaterally to auscultation without wheezes, rales, or rhonchi. Breathing is unlabored. Heart: RRR with S1 S2. No murmur , rubs, or gallops appreciated. Abdomen: Soft, non-tender, non-distended with normoactive bowel sounds. No hepatomegaly. No rebound/guarding. No obvious abdominal masses. Msk:  Strength and tone appear normal for age. Extremities: No clubbing or cyanosis. No edema.  Distal pedal pulses are 2+ and equal bilaterally. Skin: Warm and Dry Neuro: Alert and oriented X 3. CN III-XII intact Grossly normal sensory and motor function . Psych:  Responds to questions appropriately with a normal affect.      Labs: Cardiac Enzymes No results for input(s): CKTOTAL, CKMB, TROPONINI in the last 72 hours. CBC Lab Results  Component Value Date   WBC 19.2 (H) 01/02/2017   HGB 13.3 01/02/2017   HCT 39.0 01/02/2017   MCV 102.9 (H) 01/02/2017   PLT 211 01/02/2017   PROTIME: Recent Labs    01/02/17 0823  LABPROT 13.9  INR 1.08   Chemistry  Recent Labs  Lab 01/02/17 0823  NA 133*  K 4.5  CL 106  CO2 16*  BUN 82*  CREATININE 1.24  CALCIUM 9.0  GLUCOSE 122*   Lipids No results found for: CHOL, HDL, LDLCALC, TRIG BNP No results found for: PROBNP Thyroid Function Tests: Recent Labs    01/02/17 1132  TSH 0.412       Miscellaneous No results found for: DDIMER  Radiology/Studies:  Ct Head Wo Contrast  Result Date: 01/02/2017 CLINICAL DATA:  Dizziness. Fall. Struck right side of face on that test. It has tried ight bruising. EXAM: CT HEAD WITHOUT CONTRAST CT MAXILLOFACIAL WITHOUT CONTRAST CT CERVICAL SPINE WITHOUT CONTRAST TECHNIQUE: Multidetector CT imaging of the head, cervical spine, and maxillofacial structures were performed using the standard protocol without intravenous contrast. Multiplanar CT image reconstructions of the cervical spine and maxillofacial structures were also generated. COMPARISON:  None. FINDINGS: CT HEAD FINDINGS Brain: Global atrophy. No mass effect, midline shift, or acute hemorrhage. No focal acute infarct. Vascular: No hyperdense vessel or unexpected calcification.  Skull: The cranium is intact. Other: Noncontributory CT MAXILLOFACIAL FINDINGS Osseous: No acute fracture. No dislocation. No destructive bone lesion. Nasal septum deviated to the right. Orbits: No evidence of vitreous hemorrhage or orbital hemorrhage. Sinuses: Is visualized paranasal sinuses are clear. Mastoid air cells clear. Soft tissues: Airways patent. No obvious soft tissue hematoma. No obvious mass. Mild soft tissue swelling over the lateral orbital rim. It is this correlates with the patient's bruising. CT CERVICAL SPINE FINDINGS Alignment: There is slight anterolisthesis C7 upon T1 related to facet arthropathy. Otherwise anatomic alignment is preserved. Skull base and vertebrae: Mild wedging of C7 and T1 has a chronic appearance. No obvious acute fracture lines are visualized. Soft tissues and spinal canal: No obvious spinal hematoma. No obvious soft tissue hemorrhage. Thyroid is heterogeneous. 1.3 cm left thyroid hypodensity is noted. No obvious abnormal adenopathy. No evidence of aneurysm. Atherosclerotic calcifications in the bilateral carotid bulbs left greater than right. Disc levels:  C2-3 minimal central bulge and  disc osteophyte C3-4: Severe disc narrowing with posterior osteophytes, which reached cord. Severe left foraminal narrowing. Left facet arthropathy is greater than right facet arthropathy C4-5: Posterior osteophytic ridging. Central disc herniation reaches the cord. Severe right foraminal narrowing due to uncovertebral osteophytes. C5-6: Posterior osteophytic ridging, asymmetric to the left reaches the cord. Severe left foraminal narrowing due to uncovertebral osteophytes. C6-7:  Minimal posterior osteophytic ridging.  Foramina patent. C7-T1: On covering of the disc. Grossly patent. Significant bilateral facet arthropathy. Upper thoracic spine discs are unremarkable. Upper chest: No acute abnormality. Other: Noncontributory. IMPRESSION: Head: No acute intracranial pathology.  Atrophy is noted. Face: No acute bony pathology. Mild soft tissue swelling over the lateral right orbit. Cervical spine: No acute bony injury. Advanced degenerative changes as described. Electronically Signed   By: Jolaine Click M.D.   On: 01/02/2017 10:13   Ct Cervical Spine Wo Contrast  Result Date: 01/02/2017 CLINICAL DATA:  Dizziness. Fall. Struck right side of face on that test. It has tried ight bruising. EXAM: CT HEAD WITHOUT CONTRAST CT MAXILLOFACIAL WITHOUT CONTRAST CT CERVICAL SPINE WITHOUT CONTRAST TECHNIQUE: Multidetector CT imaging of the head, cervical spine, and maxillofacial structures were performed using the standard protocol without intravenous contrast. Multiplanar CT image reconstructions of the cervical spine and maxillofacial structures were also generated. COMPARISON:  None. FINDINGS: CT HEAD FINDINGS Brain: Global atrophy. No mass effect, midline shift, or acute hemorrhage. No focal acute infarct. Vascular: No hyperdense vessel or unexpected calcification. Skull: The cranium is intact. Other: Noncontributory CT MAXILLOFACIAL FINDINGS Osseous: No acute fracture. No dislocation. No destructive bone lesion. Nasal  septum deviated to the right. Orbits: No evidence of vitreous hemorrhage or orbital hemorrhage. Sinuses: Is visualized paranasal sinuses are clear. Mastoid air cells clear. Soft tissues: Airways patent. No obvious soft tissue hematoma. No obvious mass. Mild soft tissue swelling over the lateral orbital rim. It is this correlates with the patient's bruising. CT CERVICAL SPINE FINDINGS Alignment: There is slight anterolisthesis C7 upon T1 related to facet arthropathy. Otherwise anatomic alignment is preserved. Skull base and vertebrae: Mild wedging of C7 and T1 has a chronic appearance. No obvious acute fracture lines are visualized. Soft tissues and spinal canal: No obvious spinal hematoma. No obvious soft tissue hemorrhage. Thyroid is heterogeneous. 1.3 cm left thyroid hypodensity is noted. No obvious abnormal adenopathy. No evidence of aneurysm. Atherosclerotic calcifications in the bilateral carotid bulbs left greater than right. Disc levels:  C2-3 minimal central bulge and disc osteophyte C3-4: Severe disc narrowing with posterior osteophytes, which  reached cord. Severe left foraminal narrowing. Left facet arthropathy is greater than right facet arthropathy C4-5: Posterior osteophytic ridging. Central disc herniation reaches the cord. Severe right foraminal narrowing due to uncovertebral osteophytes. C5-6: Posterior osteophytic ridging, asymmetric to the left reaches the cord. Severe left foraminal narrowing due to uncovertebral osteophytes. C6-7:  Minimal posterior osteophytic ridging.  Foramina patent. C7-T1: On covering of the disc. Grossly patent. Significant bilateral facet arthropathy. Upper thoracic spine discs are unremarkable. Upper chest: No acute abnormality. Other: Noncontributory. IMPRESSION: Head: No acute intracranial pathology.  Atrophy is noted. Face: No acute bony pathology. Mild soft tissue swelling over the lateral right orbit. Cervical spine: No acute bony injury. Advanced degenerative  changes as described. Electronically Signed   By: Jolaine Click M.D.   On: 01/02/2017 10:13   Dg Chest Portable 1 View  Result Date: 01/02/2017 CLINICAL DATA:  Chest pain with shortness of breath EXAM: PORTABLE CHEST 1 VIEW COMPARISON:  None. FINDINGS: There is no edema or consolidation. The heart size and pulmonary vascularity are normal. No adenopathy. There is aortic atherosclerosis. No pneumothorax. There is degenerative change in the right shoulder and in the thoracic spine. IMPRESSION: Aortic atherosclerosis.  No edema or consolidation. Aortic Atherosclerosis (ICD10-I70.0). Electronically Signed   By: Bretta Bang III M.D.   On: 01/02/2017 08:49   Ct Maxillofacial Wo Contrast  Result Date: 01/02/2017 CLINICAL DATA:  Dizziness. Fall. Struck right side of face on that test. It has tried ight bruising. EXAM: CT HEAD WITHOUT CONTRAST CT MAXILLOFACIAL WITHOUT CONTRAST CT CERVICAL SPINE WITHOUT CONTRAST TECHNIQUE: Multidetector CT imaging of the head, cervical spine, and maxillofacial structures were performed using the standard protocol without intravenous contrast. Multiplanar CT image reconstructions of the cervical spine and maxillofacial structures were also generated. COMPARISON:  None. FINDINGS: CT HEAD FINDINGS Brain: Global atrophy. No mass effect, midline shift, or acute hemorrhage. No focal acute infarct. Vascular: No hyperdense vessel or unexpected calcification. Skull: The cranium is intact. Other: Noncontributory CT MAXILLOFACIAL FINDINGS Osseous: No acute fracture. No dislocation. No destructive bone lesion. Nasal septum deviated to the right. Orbits: No evidence of vitreous hemorrhage or orbital hemorrhage. Sinuses: Is visualized paranasal sinuses are clear. Mastoid air cells clear. Soft tissues: Airways patent. No obvious soft tissue hematoma. No obvious mass. Mild soft tissue swelling over the lateral orbital rim. It is this correlates with the patient's bruising. CT CERVICAL SPINE  FINDINGS Alignment: There is slight anterolisthesis C7 upon T1 related to facet arthropathy. Otherwise anatomic alignment is preserved. Skull base and vertebrae: Mild wedging of C7 and T1 has a chronic appearance. No obvious acute fracture lines are visualized. Soft tissues and spinal canal: No obvious spinal hematoma. No obvious soft tissue hemorrhage. Thyroid is heterogeneous. 1.3 cm left thyroid hypodensity is noted. No obvious abnormal adenopathy. No evidence of aneurysm. Atherosclerotic calcifications in the bilateral carotid bulbs left greater than right. Disc levels:  C2-3 minimal central bulge and disc osteophyte C3-4: Severe disc narrowing with posterior osteophytes, which reached cord. Severe left foraminal narrowing. Left facet arthropathy is greater than right facet arthropathy C4-5: Posterior osteophytic ridging. Central disc herniation reaches the cord. Severe right foraminal narrowing due to uncovertebral osteophytes. C5-6: Posterior osteophytic ridging, asymmetric to the left reaches the cord. Severe left foraminal narrowing due to uncovertebral osteophytes. C6-7:  Minimal posterior osteophytic ridging.  Foramina patent. C7-T1: On covering of the disc. Grossly patent. Significant bilateral facet arthropathy. Upper thoracic spine discs are unremarkable. Upper chest: No acute abnormality. Other: Noncontributory. IMPRESSION: Head: No  acute intracranial pathology.  Atrophy is noted. Face: No acute bony pathology. Mild soft tissue swelling over the lateral right orbit. Cervical spine: No acute bony injury. Advanced degenerative changes as described. Electronically Signed   By: Jolaine ClickArthur  Hoss M.D.   On: 01/02/2017 10:13    EKG:   ECG 10: 32 sinus rhythm 107  16/13/42 Axis I 05    Assessment and Plan:  Atrial fibrillation with rapid ventricular response  Syncope post micturition  Leukocytosis  Right bundle branch block left posterior fascicular block  Coronary artery disease with prior  stenting of LAD/OM     Patient has atrial fibrillation.  His chads vas score is 3.  It is appropriate that he be anticoagulated.  Post stenting, we can stop clopidgrel and continue aspirin.  We will add been as well.  Given the rate of his atrial fibrillation and its association with syncope not with standing its first occurrence recommended that we start amiodarone; long-term therapy can be [place]  We have also reviewed the physiology of post micturition syncope when he urinate if he is out of rhythm that he do it sitting down.  I have also told [place] directed following syncope.  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  In this regard it is noteworthy that there was no post termination pause when he converted.  Because of his leukocytosis is not clear.  It does raise the specter as to whether this is secondary atrial fibrillation.  Paper published in the last 5 months has raised the question as to the utility of anticoagulation secondary atrial fibrillation.  Echo is pending     Sherryl MangesSteven Demi Trieu

## 2017-01-02 NOTE — H&P (Signed)
Patient Demographics:    Stephen Crawford, is a 71 y.o. male  MRN: [id number]   DOB - Jan 15, 1946  Admit Date - 01/02/2017  Outpatient Primary MD for the patient is System, Pcp Not In   Assessment & Plan:    Principal Problem:   New onset a-fib with RVR Active Problems:   Syncope and collapse   CAD/stents   HTN (hypertension)    1)New Onset Afib with RVR- on presentation heart rate in the 180s , slowed down considerably with IV Cardizem, serial troponins pending, echocardiogram pending, TSH pending, serum magnesium pending.  Rate control with metoprolol and Cardizem as ordered. This patients CHA2DS2-VASc Score and unadjusted Ischemic Stroke Rate (% per year) is equal to 2.2 % stroke rate/year from a score of 2  .  However , Pt's Has bleed score is 2 with 4.1 % risk of major bleed (due to DAPT and h/o HTN), patient is currently on aspirin and Plavix for CAD and prior stents, will have to stop Plavix (last stent 2015) if Eliquis is started  Patient and daughter would like to wait and talk to cardiologist before making a definitive decision.  ED provider apparently discussed this case with on-call cardiologist for a cardiology consult pending.    2)AKI-patient appears to have prerenal azotemia, BUN/creatinine ratio of 70 (BUN/Cr 80/1.2),  query secondary to  hypoperfusion in the setting of new onset atrial fibrillation with syncopal episodes, hydrate IV n.p.o., consider renal ultrasound to rule out obstructive uropathy if renal function fails to improve with hydration.  Stop meloxicam, avoid other nephrotoxic agents  3)H/o HTN-hold amlodipine for now, will use Cardizem and metoprolol for rate control as above #1 discharge to help with blood pressure  4)CAD-last stent 2015, no frank ACS type symptoms, recurrent dizziness and  discomfort is most likely secondary to #1 above, increase metoprolol 50 mg twice daily for rate control as above #1, continue aspirin and Plavix for now unless Eliquis restarted in which case Plavix will be discontinued.  Serial troponins and echocardiogram pending.  Continue Lipitor  5)Syncope-syncope is most likely secondary to A. fib with RVR, place on monitored unit, watch for arrhythmias, check serial troponins and EKG for rule out ACS protocol, check echocardiogram to rule out significant aortic stenosis or other outflow obstruction, and also to evaluate EF and to rule out segmental/Regional wall motion abnormalities.  CT head and C-spine without acute findings.  see #1 above.   6) leukocytosis-White count is 19,000, ??? Reactive, okay to obtain blood cultures, hold off on antibiotics for now, UA and chest x-ray without infective type findings  With History of - Reviewed by me  Past Medical History:  Diagnosis Date  . CAD (coronary artery disease) 01/02/2017      Past Surgical History:  Procedure Laterality Date  . CORONARY ANGIOPLASTY N/A 01/02/2017    Chief Complaint  Patient presents with  . Loss of Consciousness  . Chest Pain  HPI:    Stephen Crawford  is a 71 y.o. male, with past medical history relevant for CAD with previous stents (last stent in 2015) and hypertension who presents to the ED after syncopal episodes x2  The patient lives in [place] area, was here visiting a friend this weekend, overnight passed out twice with facial injuries and loss of consciousness.  Complained of significant nausea, dizziness, palpitations and shortness of breath as well as dyspnea on exertion.  He has some chest discomfort.  No pleuritic symptoms.  Did not vomit, no diarrhea  No fevers or chills and no urinary symptoms   Patient's daughter at bedside, additional information obtained  In ED patient is found to be in A. fib with RVR with heart rate around 180, responded well to IV  Cardizem.Marland Kitchen  He is also found to have elevated BUN, and leukocytosis  IV fluids initiated in the ED     Review of systems:    In addition to the HPI above,   A full 12 point Review of 10 Systems was done, except as stated above, all other Review of 10 Systems were negative.    Social History:  Reviewed by me    Social History   Tobacco Use  . Smoking status: Unknown If Ever Smoked  . Tobacco comment: not currently smoking  Substance Use Topics  . Alcohol use: No    Frequency: Never       Family History :  Reviewed by me    Family History  Problem Relation Age of Onset  . Hypertension Father     Home Medications:   Prior to Admission medications   Medication Sig Start Date End Date Taking? Authorizing Provider  amLODipine (NORVASC) 5 MG tablet Take 5 mg daily by mouth.   Yes [provider]  aspirin EC 81 MG tablet Take 81 mg daily by mouth.   Yes [provider]  atorvastatin (LIPITOR) 20 MG tablet Take 20 mg daily by mouth.   Yes [provider]  clopidogrel (PLAVIX) 75 MG tablet Take 75 mg daily by mouth.   Yes [provider]  escitalopram (LEXAPRO) 10 MG tablet Take 10 mg daily by mouth.   Yes [provider]  meloxicam (MOBIC) 7.5 MG tablet Take 7.5 mg daily by mouth.   Yes [provider]  metoprolol tartrate (LOPRESSOR) 25 MG tablet Take 25 mg 2 (two) times daily by mouth.   Yes [provider]  traZODone (DESYREL) 150 MG tablet Take 75 mg daily as needed by mouth for sleep.    Yes [provider]     Allergies:    Not on File   Physical Exam:   Vitals  Blood pressure 131/80, pulse (!) 112, temperature 98.2 F (36.8 C), temperature source Oral, resp. rate 20, height 5\' 9"  (1.753 m), weight 80.3 kg (177 lb), SpO2 97 %.  Physical Examination: General appearance - alert, well appearing, and in no distress Mental status - alert, oriented to person, place, and time,  Head-right  periorbital and zygomatic area bruising/ecchymosis Eyes - sclera anicteric Neck - supple, no JVD elevation , Chest - clear  to auscultation bilaterally, symmetrical air movement,  Heart - S1 and S2 normal, irregularly irregular with heart rate around 110 (HR was 180 earlier) Abdomen - soft, nontender, nondistended, no CVA tenderness Neurological - screening mental status exam normal, neck supple without rigidity, cranial nerves II through XII intact, DTR's normal and symmetric, no new focal deficits, no tremors  Extremities - no significant pedal edema noted, intact peripheral pulses  Skin - warm, dry    Data Review:    CBC Recent Labs  Lab 01/02/17 0823  WBC 19.2*  HGB 13.3  HCT 39.0  PLT 211  MCV 102.9*  MCH 35.1*  MCHC 34.1  RDW 14.0   ------------------------------------------------------------------------------------------------------------------  Chemistries  Recent Labs  Lab 01/02/17 0823 01/02/17 1132  NA 133*  --   K 4.5  --   CL 106  --   CO2 16*  --   GLUCOSE 122*  --   BUN 82*  --   CREATININE 1.24  --   CALCIUM 9.0  --   MG  --  1.5*   ------------------------------------------------------------------------------------------------------------------ estimated creatinine clearance is 53.1 mL/min (by C-G formula based on SCr of 1.24 mg/dL). ------------------------------------------------------------------------------------------------------------------ Recent Labs    01/02/17 1132  TSH 0.412     Coagulation profile Recent Labs  Lab 01/02/17 0823  INR 1.08   ------------------------------------------------------------------------------------------------------------------- No results for input(s): DDIMER in the last 72 hours. -------------------------------------------------------------------------------------------------------------------  Cardiac Enzymes No results for input(s): CKMB, TROPONINI, MYOGLOBIN in the last 168 hours.  Invalid  input(s): CK ------------------------------------------------------------------------------------------------------------------ No results found for: BNP   ---------------------------------------------------------------------------------------------------------------  Urinalysis    Component Value Date/Time   COLORURINE YELLOW 01/02/2017 1034   APPEARANCEUR CLEAR 01/02/2017 1034   LABSPEC 1.019 01/02/2017 1034   PHURINE 5.0 01/02/2017 1034   GLUCOSEU NEGATIVE 01/02/2017 1034   HGBUR SMALL (A) 01/02/2017 1034   BILIRUBINUR NEGATIVE 01/02/2017 1034   KETONESUR NEGATIVE 01/02/2017 1034   PROTEINUR NEGATIVE 01/02/2017 1034   NITRITE NEGATIVE 01/02/2017 1034   LEUKOCYTESUR NEGATIVE 01/02/2017 1034    ----------------------------------------------------------------------------------------------------------------   Imaging Results:    Ct Head Wo Contrast  Result Date: 01/02/2017 CLINICAL DATA:  Dizziness. Fall. Struck right side of face on that test. It has tried ight bruising. EXAM: CT HEAD WITHOUT CONTRAST CT MAXILLOFACIAL WITHOUT CONTRAST CT CERVICAL SPINE WITHOUT CONTRAST TECHNIQUE: Multidetector CT imaging of the head, cervical spine, and maxillofacial structures were performed using the standard protocol without intravenous contrast. Multiplanar CT image reconstructions of the cervical spine and maxillofacial structures were also generated. COMPARISON:  None. FINDINGS: CT HEAD FINDINGS Brain: Global atrophy. No mass effect, midline shift, or acute hemorrhage. No focal acute infarct. Vascular: No hyperdense vessel or unexpected calcification. Skull: The cranium is intact. Other: Noncontributory CT MAXILLOFACIAL FINDINGS Osseous: No acute fracture. No dislocation. No destructive bone lesion. Nasal septum deviated to the right. Orbits: No evidence of vitreous hemorrhage or orbital hemorrhage. Sinuses: Is visualized paranasal sinuses are clear. Mastoid air cells clear. Soft tissues: Airways  patent. No obvious soft tissue hematoma. No obvious mass. Mild soft tissue swelling over the lateral orbital rim. It is this correlates with the patient's bruising. CT CERVICAL SPINE FINDINGS Alignment: There is slight anterolisthesis C7 upon T1 related to facet arthropathy. Otherwise anatomic alignment is preserved. Skull base and vertebrae: Mild wedging of C7 and T1 has a chronic appearance. No obvious acute fracture lines are visualized. Soft tissues and spinal canal: No obvious spinal hematoma. No obvious soft tissue hemorrhage. Thyroid is heterogeneous. 1.3 cm left thyroid hypodensity is noted. No obvious abnormal adenopathy. No evidence of aneurysm. Atherosclerotic calcifications in the bilateral carotid bulbs left greater than right. Disc levels:  C2-3 minimal central bulge and disc osteophyte C3-4: Severe disc narrowing with posterior osteophytes, which reached cord. Severe left foraminal narrowing. Left facet arthropathy is greater than right facet arthropathy C4-5: Posterior osteophytic ridging. Central disc herniation  reaches the cord. Severe right foraminal narrowing due to uncovertebral osteophytes. C5-6: Posterior osteophytic ridging, asymmetric to the left reaches the cord. Severe left foraminal narrowing due to uncovertebral osteophytes. C6-7:  Minimal posterior osteophytic ridging.  Foramina patent. C7-T1: On covering of the disc. Grossly patent. Significant bilateral facet arthropathy. Upper thoracic spine discs are unremarkable. Upper chest: No acute abnormality. Other: Noncontributory. IMPRESSION: Head: No acute intracranial pathology.  Atrophy is noted. Face: No acute bony pathology. Mild soft tissue swelling over the lateral right orbit. Cervical spine: No acute bony injury. Advanced degenerative changes as described. Electronically Signed   By: Jolaine ClickArthur  Hoss M.D.   On: 01/02/2017 10:13   Ct Cervical Spine Wo Contrast  Result Date: 01/02/2017 CLINICAL DATA:  Dizziness. Fall. Struck right  side of face on that test. It has tried ight bruising. EXAM: CT HEAD WITHOUT CONTRAST CT MAXILLOFACIAL WITHOUT CONTRAST CT CERVICAL SPINE WITHOUT CONTRAST TECHNIQUE: Multidetector CT imaging of the head, cervical spine, and maxillofacial structures were performed using the standard protocol without intravenous contrast. Multiplanar CT image reconstructions of the cervical spine and maxillofacial structures were also generated. COMPARISON:  None. FINDINGS: CT HEAD FINDINGS Brain: Global atrophy. No mass effect, midline shift, or acute hemorrhage. No focal acute infarct. Vascular: No hyperdense vessel or unexpected calcification. Skull: The cranium is intact. Other: Noncontributory CT MAXILLOFACIAL FINDINGS Osseous: No acute fracture. No dislocation. No destructive bone lesion. Nasal septum deviated to the right. Orbits: No evidence of vitreous hemorrhage or orbital hemorrhage. Sinuses: Is visualized paranasal sinuses are clear. Mastoid air cells clear. Soft tissues: Airways patent. No obvious soft tissue hematoma. No obvious mass. Mild soft tissue swelling over the lateral orbital rim. It is this correlates with the patient's bruising. CT CERVICAL SPINE FINDINGS Alignment: There is slight anterolisthesis C7 upon T1 related to facet arthropathy. Otherwise anatomic alignment is preserved. Skull base and vertebrae: Mild wedging of C7 and T1 has a chronic appearance. No obvious acute fracture lines are visualized. Soft tissues and spinal canal: No obvious spinal hematoma. No obvious soft tissue hemorrhage. Thyroid is heterogeneous. 1.3 cm left thyroid hypodensity is noted. No obvious abnormal adenopathy. No evidence of aneurysm. Atherosclerotic calcifications in the bilateral carotid bulbs left greater than right. Disc levels:  C2-3 minimal central bulge and disc osteophyte C3-4: Severe disc narrowing with posterior osteophytes, which reached cord. Severe left foraminal narrowing. Left facet arthropathy is greater than  right facet arthropathy C4-5: Posterior osteophytic ridging. Central disc herniation reaches the cord. Severe right foraminal narrowing due to uncovertebral osteophytes. C5-6: Posterior osteophytic ridging, asymmetric to the left reaches the cord. Severe left foraminal narrowing due to uncovertebral osteophytes. C6-7:  Minimal posterior osteophytic ridging.  Foramina patent. C7-T1: On covering of the disc. Grossly patent. Significant bilateral facet arthropathy. Upper thoracic spine discs are unremarkable. Upper chest: No acute abnormality. Other: Noncontributory. IMPRESSION: Head: No acute intracranial pathology.  Atrophy is noted. Face: No acute bony pathology. Mild soft tissue swelling over the lateral right orbit. Cervical spine: No acute bony injury. Advanced degenerative changes as described. Electronically Signed   By: Jolaine ClickArthur  Hoss M.D.   On: 01/02/2017 10:13   Dg Chest Portable 1 View  Result Date: 01/02/2017 CLINICAL DATA:  Chest pain with shortness of breath EXAM: PORTABLE CHEST 1 VIEW COMPARISON:  None. FINDINGS: There is no edema or consolidation. The heart size and pulmonary vascularity are normal. No adenopathy. There is aortic atherosclerosis. No pneumothorax. There is degenerative change in the right shoulder and in the thoracic spine. IMPRESSION:  Aortic atherosclerosis.  No edema or consolidation. Aortic Atherosclerosis (ICD10-I70.0). Electronically Signed   By: Bretta Bang III M.D.   On: 01/02/2017 08:49   Ct Maxillofacial Wo Contrast  Result Date: 01/02/2017 CLINICAL DATA:  Dizziness. Fall. Struck right side of face on that test. It has tried ight bruising. EXAM: CT HEAD WITHOUT CONTRAST CT MAXILLOFACIAL WITHOUT CONTRAST CT CERVICAL SPINE WITHOUT CONTRAST TECHNIQUE: Multidetector CT imaging of the head, cervical spine, and maxillofacial structures were performed using the standard protocol without intravenous contrast. Multiplanar CT image reconstructions of the cervical spine and  maxillofacial structures were also generated. COMPARISON:  None. FINDINGS: CT HEAD FINDINGS Brain: Global atrophy. No mass effect, midline shift, or acute hemorrhage. No focal acute infarct. Vascular: No hyperdense vessel or unexpected calcification. Skull: The cranium is intact. Other: Noncontributory CT MAXILLOFACIAL FINDINGS Osseous: No acute fracture. No dislocation. No destructive bone lesion. Nasal septum deviated to the right. Orbits: No evidence of vitreous hemorrhage or orbital hemorrhage. Sinuses: Is visualized paranasal sinuses are clear. Mastoid air cells clear. Soft tissues: Airways patent. No obvious soft tissue hematoma. No obvious mass. Mild soft tissue swelling over the lateral orbital rim. It is this correlates with the patient's bruising. CT CERVICAL SPINE FINDINGS Alignment: There is slight anterolisthesis C7 upon T1 related to facet arthropathy. Otherwise anatomic alignment is preserved. Skull base and vertebrae: Mild wedging of C7 and T1 has a chronic appearance. No obvious acute fracture lines are visualized. Soft tissues and spinal canal: No obvious spinal hematoma. No obvious soft tissue hemorrhage. Thyroid is heterogeneous. 1.3 cm left thyroid hypodensity is noted. No obvious abnormal adenopathy. No evidence of aneurysm. Atherosclerotic calcifications in the bilateral carotid bulbs left greater than right. Disc levels:  C2-3 minimal central bulge and disc osteophyte C3-4: Severe disc narrowing with posterior osteophytes, which reached cord. Severe left foraminal narrowing. Left facet arthropathy is greater than right facet arthropathy C4-5: Posterior osteophytic ridging. Central disc herniation reaches the cord. Severe right foraminal narrowing due to uncovertebral osteophytes. C5-6: Posterior osteophytic ridging, asymmetric to the left reaches the cord. Severe left foraminal narrowing due to uncovertebral osteophytes. C6-7:  Minimal posterior osteophytic ridging.  Foramina patent. C7-T1:  On covering of the disc. Grossly patent. Significant bilateral facet arthropathy. Upper thoracic spine discs are unremarkable. Upper chest: No acute abnormality. Other: Noncontributory. IMPRESSION: Head: No acute intracranial pathology.  Atrophy is noted. Face: No acute bony pathology. Mild soft tissue swelling over the lateral right orbit. Cervical spine: No acute bony injury. Advanced degenerative changes as described. Electronically Signed   By: Jolaine Click M.D.   On: 01/02/2017 10:13    Radiological Exams on Admission: Ct Head Wo Contrast  Result Date: 01/02/2017 CLINICAL DATA:  Dizziness. Fall. Struck right side of face on that test. It has tried ight bruising. EXAM: CT HEAD WITHOUT CONTRAST CT MAXILLOFACIAL WITHOUT CONTRAST CT CERVICAL SPINE WITHOUT CONTRAST TECHNIQUE: Multidetector CT imaging of the head, cervical spine, and maxillofacial structures were performed using the standard protocol without intravenous contrast. Multiplanar CT image reconstructions of the cervical spine and maxillofacial structures were also generated. COMPARISON:  None. FINDINGS: CT HEAD FINDINGS Brain: Global atrophy. No mass effect, midline shift, or acute hemorrhage. No focal acute infarct. Vascular: No hyperdense vessel or unexpected calcification. Skull: The cranium is intact. Other: Noncontributory CT MAXILLOFACIAL FINDINGS Osseous: No acute fracture. No dislocation. No destructive bone lesion. Nasal septum deviated to the right. Orbits: No evidence of vitreous hemorrhage or orbital hemorrhage. Sinuses: Is visualized paranasal sinuses are clear. Mastoid  air cells clear. Soft tissues: Airways patent. No obvious soft tissue hematoma. No obvious mass. Mild soft tissue swelling over the lateral orbital rim. It is this correlates with the patient's bruising. CT CERVICAL SPINE FINDINGS Alignment: There is slight anterolisthesis C7 upon T1 related to facet arthropathy. Otherwise anatomic alignment is preserved. Skull base and  vertebrae: Mild wedging of C7 and T1 has a chronic appearance. No obvious acute fracture lines are visualized. Soft tissues and spinal canal: No obvious spinal hematoma. No obvious soft tissue hemorrhage. Thyroid is heterogeneous. 1.3 cm left thyroid hypodensity is noted. No obvious abnormal adenopathy. No evidence of aneurysm. Atherosclerotic calcifications in the bilateral carotid bulbs left greater than right. Disc levels:  C2-3 minimal central bulge and disc osteophyte C3-4: Severe disc narrowing with posterior osteophytes, which reached cord. Severe left foraminal narrowing. Left facet arthropathy is greater than right facet arthropathy C4-5: Posterior osteophytic ridging. Central disc herniation reaches the cord. Severe right foraminal narrowing due to uncovertebral osteophytes. C5-6: Posterior osteophytic ridging, asymmetric to the left reaches the cord. Severe left foraminal narrowing due to uncovertebral osteophytes. C6-7:  Minimal posterior osteophytic ridging.  Foramina patent. C7-T1: On covering of the disc. Grossly patent. Significant bilateral facet arthropathy. Upper thoracic spine discs are unremarkable. Upper chest: No acute abnormality. Other: Noncontributory. IMPRESSION: Head: No acute intracranial pathology.  Atrophy is noted. Face: No acute bony pathology. Mild soft tissue swelling over the lateral right orbit. Cervical spine: No acute bony injury. Advanced degenerative changes as described. Electronically Signed   By: Jolaine Click M.D.   On: 01/02/2017 10:13   Ct Cervical Spine Wo Contrast  Result Date: 01/02/2017 CLINICAL DATA:  Dizziness. Fall. Struck right side of face on that test. It has tried ight bruising. EXAM: CT HEAD WITHOUT CONTRAST CT MAXILLOFACIAL WITHOUT CONTRAST CT CERVICAL SPINE WITHOUT CONTRAST TECHNIQUE: Multidetector CT imaging of the head, cervical spine, and maxillofacial structures were performed using the standard protocol without intravenous contrast. Multiplanar CT  image reconstructions of the cervical spine and maxillofacial structures were also generated. COMPARISON:  None. FINDINGS: CT HEAD FINDINGS Brain: Global atrophy. No mass effect, midline shift, or acute hemorrhage. No focal acute infarct. Vascular: No hyperdense vessel or unexpected calcification. Skull: The cranium is intact. Other: Noncontributory CT MAXILLOFACIAL FINDINGS Osseous: No acute fracture. No dislocation. No destructive bone lesion. Nasal septum deviated to the right. Orbits: No evidence of vitreous hemorrhage or orbital hemorrhage. Sinuses: Is visualized paranasal sinuses are clear. Mastoid air cells clear. Soft tissues: Airways patent. No obvious soft tissue hematoma. No obvious mass. Mild soft tissue swelling over the lateral orbital rim. It is this correlates with the patient's bruising. CT CERVICAL SPINE FINDINGS Alignment: There is slight anterolisthesis C7 upon T1 related to facet arthropathy. Otherwise anatomic alignment is preserved. Skull base and vertebrae: Mild wedging of C7 and T1 has a chronic appearance. No obvious acute fracture lines are visualized. Soft tissues and spinal canal: No obvious spinal hematoma. No obvious soft tissue hemorrhage. Thyroid is heterogeneous. 1.3 cm left thyroid hypodensity is noted. No obvious abnormal adenopathy. No evidence of aneurysm. Atherosclerotic calcifications in the bilateral carotid bulbs left greater than right. Disc levels:  C2-3 minimal central bulge and disc osteophyte C3-4: Severe disc narrowing with posterior osteophytes, which reached cord. Severe left foraminal narrowing. Left facet arthropathy is greater than right facet arthropathy C4-5: Posterior osteophytic ridging. Central disc herniation reaches the cord. Severe right foraminal narrowing due to uncovertebral osteophytes. C5-6: Posterior osteophytic ridging, asymmetric to the left reaches the  cord. Severe left foraminal narrowing due to uncovertebral osteophytes. C6-7:  Minimal posterior  osteophytic ridging.  Foramina patent. C7-T1: On covering of the disc. Grossly patent. Significant bilateral facet arthropathy. Upper thoracic spine discs are unremarkable. Upper chest: No acute abnormality. Other: Noncontributory. IMPRESSION: Head: No acute intracranial pathology.  Atrophy is noted. Face: No acute bony pathology. Mild soft tissue swelling over the lateral right orbit. Cervical spine: No acute bony injury. Advanced degenerative changes as described. Electronically Signed   By: Jolaine ClickArthur  Hoss M.D.   On: 01/02/2017 10:13   Dg Chest Portable 1 View  Result Date: 01/02/2017 CLINICAL DATA:  Chest pain with shortness of breath EXAM: PORTABLE CHEST 1 VIEW COMPARISON:  None. FINDINGS: There is no edema or consolidation. The heart size and pulmonary vascularity are normal. No adenopathy. There is aortic atherosclerosis. No pneumothorax. There is degenerative change in the right shoulder and in the thoracic spine. IMPRESSION: Aortic atherosclerosis.  No edema or consolidation. Aortic Atherosclerosis (ICD10-I70.0). Electronically Signed   By: Bretta BangWilliam  Woodruff III M.D.   On: 01/02/2017 08:49   Ct Maxillofacial Wo Contrast  Result Date: 01/02/2017 CLINICAL DATA:  Dizziness. Fall. Struck right side of face on that test. It has tried ight bruising. EXAM: CT HEAD WITHOUT CONTRAST CT MAXILLOFACIAL WITHOUT CONTRAST CT CERVICAL SPINE WITHOUT CONTRAST TECHNIQUE: Multidetector CT imaging of the head, cervical spine, and maxillofacial structures were performed using the standard protocol without intravenous contrast. Multiplanar CT image reconstructions of the cervical spine and maxillofacial structures were also generated. COMPARISON:  None. FINDINGS: CT HEAD FINDINGS Brain: Global atrophy. No mass effect, midline shift, or acute hemorrhage. No focal acute infarct. Vascular: No hyperdense vessel or unexpected calcification. Skull: The cranium is intact. Other: Noncontributory CT MAXILLOFACIAL FINDINGS Osseous:  No acute fracture. No dislocation. No destructive bone lesion. Nasal septum deviated to the right. Orbits: No evidence of vitreous hemorrhage or orbital hemorrhage. Sinuses: Is visualized paranasal sinuses are clear. Mastoid air cells clear. Soft tissues: Airways patent. No obvious soft tissue hematoma. No obvious mass. Mild soft tissue swelling over the lateral orbital rim. It is this correlates with the patient's bruising. CT CERVICAL SPINE FINDINGS Alignment: There is slight anterolisthesis C7 upon T1 related to facet arthropathy. Otherwise anatomic alignment is preserved. Skull base and vertebrae: Mild wedging of C7 and T1 has a chronic appearance. No obvious acute fracture lines are visualized. Soft tissues and spinal canal: No obvious spinal hematoma. No obvious soft tissue hemorrhage. Thyroid is heterogeneous. 1.3 cm left thyroid hypodensity is noted. No obvious abnormal adenopathy. No evidence of aneurysm. Atherosclerotic calcifications in the bilateral carotid bulbs left greater than right. Disc levels:  C2-3 minimal central bulge and disc osteophyte C3-4: Severe disc narrowing with posterior osteophytes, which reached cord. Severe left foraminal narrowing. Left facet arthropathy is greater than right facet arthropathy C4-5: Posterior osteophytic ridging. Central disc herniation reaches the cord. Severe right foraminal narrowing due to uncovertebral osteophytes. C5-6: Posterior osteophytic ridging, asymmetric to the left reaches the cord. Severe left foraminal narrowing due to uncovertebral osteophytes. C6-7:  Minimal posterior osteophytic ridging.  Foramina patent. C7-T1: On covering of the disc. Grossly patent. Significant bilateral facet arthropathy. Upper thoracic spine discs are unremarkable. Upper chest: No acute abnormality. Other: Noncontributory. IMPRESSION: Head: No acute intracranial pathology.  Atrophy is noted. Face: No acute bony pathology. Mild soft tissue swelling over the lateral right  orbit. Cervical spine: No acute bony injury. Advanced degenerative changes as described. Electronically Signed   By: Dahlia ClientArthur  Hoss M.D.  On: 01/02/2017 10:13   DVT Prophylaxis -SCD /heparin AM Labs Ordered, also please review Full Orders  Family Communication: Admission, patients condition and plan of care including tests being ordered have been discussed with the patient and daughter who indicate understanding and agree with the plan   Code Status - Full Code  Likely DC to  Home   Condition   stable  Lollie Gunner M.D on 01/02/2017 at 12:40 PM   Between 7am to 7pm - Pager - [phone] After 7pm go to [email or web address] - password [email or web address]  [facility] - Office  [phone]  Voice Recognition Reubin Milan dictation system was used to create this note, attempts have been made to correct errors. Please contact the author with questions and/or clarifications.

## 2017-01-03 ENCOUNTER — Other Ambulatory Visit: Payer: Self-pay

## 2017-01-03 ENCOUNTER — Inpatient Hospital Stay (HOSPITAL_COMMUNITY): Payer: Medicare HMO

## 2017-01-03 DIAGNOSIS — I5030 Unspecified diastolic (congestive) heart failure: Secondary | ICD-10-CM

## 2017-01-03 DIAGNOSIS — R55 Syncope and collapse: Secondary | ICD-10-CM

## 2017-01-03 LAB — CBC
HCT: 26.6 % — ABNORMAL LOW (ref 39.0–52.0)
HCT: 22.2 % — ABNORMAL LOW (ref 39.0–52.0)
HEMOGLOBIN: 9.3 g/dL — AB (ref 13.0–17.0)
Hemoglobin: 7.6 g/dL — ABNORMAL LOW (ref 13.0–17.0)
MCH: 36 pg — AB (ref 26.0–34.0)
MCH: 34.9 pg — ABNORMAL HIGH (ref 26.0–34.0)
MCHC: 35 g/dL (ref 30.0–36.0)
MCHC: 34.2 g/dL (ref 30.0–36.0)
MCV: 103.1 fL — ABNORMAL HIGH (ref 78.0–100.0)
MCV: 101.8 fL — ABNORMAL HIGH (ref 78.0–100.0)
PLATELETS: 168 10*3/uL (ref 150–400)
PLATELETS: 148 10*3/uL — AB (ref 150–400)
RBC: 2.58 MIL/uL — AB (ref 4.22–5.81)
RBC: 2.18 MIL/uL — ABNORMAL LOW (ref 4.22–5.81)
RDW: 14.6 % (ref 11.5–15.5)
RDW: 14.5 % (ref 11.5–15.5)
WBC: 11.1 10*3/uL — ABNORMAL HIGH (ref 4.0–10.5)
WBC: 13.1 10*3/uL — AB (ref 4.0–10.5)

## 2017-01-03 LAB — BASIC METABOLIC PANEL
ANION GAP: 6 (ref 5–15)
BUN: 70 mg/dL — ABNORMAL HIGH (ref 6–20)
CALCIUM: 8.2 mg/dL — AB (ref 8.9–10.3)
CO2: 19 mmol/L — AB (ref 22–32)
CREATININE: 1.17 mg/dL (ref 0.61–1.24)
Chloride: 113 mmol/L — ABNORMAL HIGH (ref 101–111)
GLUCOSE: 110 mg/dL — AB (ref 65–99)
Potassium: 4.1 mmol/L (ref 3.5–5.1)
Sodium: 138 mmol/L (ref 135–145)

## 2017-01-03 LAB — ECHOCARDIOGRAM COMPLETE
Height: 69 in
Weight: 2596.8 oz

## 2017-01-03 LAB — HEMOGLOBIN AND HEMATOCRIT, BLOOD
HEMATOCRIT: 23.3 % — AB (ref 39.0–52.0)
HEMOGLOBIN: 7.9 g/dL — AB (ref 13.0–17.0)

## 2017-01-03 LAB — ABO/RH: ABO/RH(D): A POS

## 2017-01-03 LAB — MRSA PCR SCREENING: MRSA BY PCR: NEGATIVE

## 2017-01-03 LAB — PROTIME-INR
INR: 1.27
Prothrombin Time: 15.8 seconds — ABNORMAL HIGH (ref 11.4–15.2)

## 2017-01-03 LAB — APTT: aPTT: 24 seconds (ref 24–36)

## 2017-01-03 LAB — OCCULT BLOOD X 1 CARD TO LAB, STOOL: FECAL OCCULT BLD: POSITIVE — AB

## 2017-01-03 LAB — PREPARE RBC (CROSSMATCH)

## 2017-01-03 LAB — TROPONIN I
TROPONIN I: 0.38 ng/mL — AB (ref <0.03)
Troponin I: 0.13 ng/mL (ref <0.03)

## 2017-01-03 MED ORDER — SODIUM CHLORIDE 0.9 % IV SOLN
80.0000 mg | Freq: Once | INTRAVENOUS | Status: AC
  Administered 2017-01-03: 80 mg via INTRAVENOUS
  Filled 2017-01-03: qty 80

## 2017-01-03 MED ORDER — NITROGLYCERIN 0.4 MG SL SUBL
SUBLINGUAL_TABLET | SUBLINGUAL | Status: AC
  Administered 2017-01-03: 0.4 mg via SUBLINGUAL
  Filled 2017-01-03: qty 1

## 2017-01-03 MED ORDER — SODIUM CHLORIDE 0.9 % IV SOLN: Freq: Once | INTRAVENOUS | Status: DC

## 2017-01-03 MED ORDER — PANTOPRAZOLE SODIUM 40 MG IV SOLR
8.0000 mg/h | INTRAVENOUS | Status: DC
  Administered 2017-01-03 – 2017-01-06 (×6): 8 mg/h via INTRAVENOUS
  Filled 2017-01-03 (×10): qty 80

## 2017-01-03 MED ORDER — MAGNESIUM SULFATE 2 GM/50ML IV SOLN
2.0000 g | Freq: Once | INTRAVENOUS | Status: AC
  Administered 2017-01-03: 2 g via INTRAVENOUS
  Filled 2017-01-03: qty 50

## 2017-01-03 MED ORDER — PANTOPRAZOLE SODIUM 40 MG IV SOLR
40.0000 mg | Freq: Two times a day (BID) | INTRAVENOUS | Status: DC
  Administered 2017-01-03 – 2017-01-06 (×7): 40 mg via INTRAVENOUS
  Filled 2017-01-03 (×7): qty 40

## 2017-01-03 MED ORDER — SODIUM CHLORIDE 0.9 % IV SOLN
INTRAVENOUS | Status: DC
  Administered 2017-01-03: 13:00:00 via INTRAVENOUS

## 2017-01-03 MED ORDER — PANTOPRAZOLE SODIUM 40 MG IV SOLR: 40.0000 mg | Freq: Two times a day (BID) | INTRAVENOUS | Status: DC

## 2017-01-03 MED ORDER — MORPHINE SULFATE (PF) 2 MG/ML IV SOLN
INTRAVENOUS | Status: AC
  Administered 2017-01-03: 2 mg via INTRAVENOUS
  Filled 2017-01-03: qty 1

## 2017-01-03 MED ORDER — NITROGLYCERIN 0.4 MG SL SUBL
0.4000 mg | SUBLINGUAL_TABLET | SUBLINGUAL | Status: DC | PRN
Start: 2017-01-03 — End: 2017-01-11
  Administered 2017-01-03 – 2017-01-04 (×3): 0.4 mg via SUBLINGUAL
  Filled 2017-01-03: qty 1

## 2017-01-03 NOTE — Progress Notes (Signed)
PROGRESS NOTE    Stephen PeruWilliam Crawford  ZOX:[id number] DOB: May 15, 1945 DOA: 01/02/2017 PCP: System, Pcp Not In   Brief Narrative: 71 year old male with history of CAD, MI, stenting in 2015 admitted with syncopal episodes x2 at home.  But this was associated with nausea shortness of breath and lightheadedness.  He reported both the incidents after passing urine.  He hit his back of his head and bruising bruised his back of his head and has ecchymosis on the right side of his face.  When he came to the emergency room his heart rate was in on Cardizem drip and he was converted to sinus rhythm.  He has no history of previous syncopal episodes.  He was otherwise in his usual state of health.  He denies any chest pain though his daughter states that he has some chest discomfort.  He has a history of ischemic heart disease and LAD PCI in 2015.  Echocardiogram in 2015 showed normal LV function. Today when I saw him patient continues to have nausea with no vomiting, patient also reports that he had diarrhea which was black in color.  He stood up to urinate early this morning and had another syncopal event in the hospital.  His orthostatics were done this morning and his systolic blood pressure dropped significantly from 123-80.  His systolic blood pressure was 123 laying down and 80 standing up.  And he was his heart rate jumped up to 20 beats when he was standing.  He was started on IV hydration right away with 250 cc bolus.  Stools are pending at this time.  A GI consult is called.  And he was started on IV Protonix.  Daughter is by the bedside. Assessment & Plan:   Principal Problem:   New onset a-fib with RVR Active Problems:   Syncope and collapse   CAD/stents   HTN (hypertension)  New onset A. fib with RVR status post Cardizem now in normal sinus rhythm heart rate anywhere from 90 to 100.  He was also started on amiodarone by cardiology.  He was placed on Eliquis at the time of admission.  Will hold Eliquis at  this time due to GI bleed.  Patient was on aspirin and Plavix at home.  Patient have an echocardiogram done later today.  Normal TSH.  Anemia with drop in hemoglobin from 13.3-19.3.  Patient has black stools guaiac stools are pending at this time.  GI consult called will see the patient today.  Started on IV Protonix twice a day.  Serial H&H being done with follow-up labs tomorrow.  Syncope-multifactorial?  A. fib RVR versus micturition syncope versus orthostatic syncope.  Continue amiodarone for A. fib.  Patient started on IV hydration for orthostatic hypotension.  Will consider midodrine if there is no improvement after IV hydration.  Leukocytosis at the time of admission his white count was 19,000 down to 11,000 without any treatment with antibiotics.  Question stress related or reactive.  Follow-up tomorrow.  UA and chest x-ray has been negative.  There is no signs of active infection at this time.  History of hypertension now with orthostatic hypotension amlodipine on hold at this time.  Continue amiodarone.  AK I secondary to dehydration follow-up levels after hydration.  Meloxicam has been stopped at this time.  Hypomagnesemia replete and recheck levels.  DVT prophylaxis:scd Code Status:full Family Communication: Discussed with daughter isposition Plan: To be determined Consultants:  Cardiology and GI Procedures:  none Antimicrobials: None Subjective: Feeling nauseated second the stomach  and lightheaded. Objective: Vitals:   01/03/17 0029 01/03/17 0300 01/03/17 0315 01/03/17 0810  BP: 127/67 126/71 122/75 123/70  Pulse: 74 72 86 (!) 102  Resp: 17 17 16 20   Temp: 99.4 F (37.4 C) 98.6 F (37 C) 98.6 F (37 C) 98.4 F (36.9 C)  TempSrc: Oral Oral Oral Oral  SpO2: 98% 98% 98% 98%  Weight:   73.6 kg (162 lb 4.8 oz)   Height:        Intake/Output Summary (Last 24 hours) at 01/03/2017 0953 Last data filed at 01/03/2017 0827 Gross per 24 hour  Intake 600 ml  Output 1400  ml  Net -800 ml   Filed Weights   01/02/17 0820 01/03/17 0315  Weight: 80.3 kg (177 lb) 73.6 kg (162 lb 4.8 oz)    Examination:  General exam: Appears calm and comfortable .oRAL MUCOSA DRY.Respiratory system: Clear to auscultation. Respiratory effort normal. Cardiovascular system: S1 & S2 heard, RRR. No JVD, murmurs, rubs, gallops or clicks. No pedal edema. Gastrointestinal system: Abdomen is nondistended, soft and nontender. No organomegaly or masses felt. Normal bowel sounds heard. Central nervous system: Alert and oriented. No focal neurological deficits. Extremities: Symmetric 5 x 5 power. Skin: No rashes, lesions or ulcers Psychiatry: Judgement and insight appear normal. Mood & affect appropriate.     Data Reviewed: I have personally reviewed following labs and imaging studies  CBC: Recent Labs  Lab 01/02/17 0823 01/03/17 0258  WBC 19.2* 11.1*  HGB 13.3 9.3*  HCT 39.0 26.6*  MCV 102.9* 103.1*  PLT 211 168   Basic Metabolic Panel: Recent Labs  Lab 01/02/17 0823 01/02/17 1132 01/02/17 1558 01/03/17 0258  NA 133*  --  138 138  K 4.5  --  4.1 4.1  CL 106  --  113* 113*  CO2 16*  --  21* 19*  GLUCOSE 122*  --  106* 110*  BUN 82*  --  75* 70*  CREATININE 1.24  --  1.11 1.17  CALCIUM 9.0  --  8.3* 8.2*  MG  --  1.5*  --   --    GFR: Estimated Creatinine Clearance: 56.2 mL/min (by C-G formula based on SCr of 1.17 mg/dL). Liver Function Tests: No results for input(s): AST, ALT, ALKPHOS, BILITOT, PROT, ALBUMIN in the last 168 hours. No results for input(s): LIPASE, AMYLASE in the last 168 hours. No results for input(s): AMMONIA in the last 168 hours. Coagulation Profile: Recent Labs  Lab 01/02/17 0823  INR 1.08   Cardiac Enzymes: Recent Labs  Lab 01/02/17 1558 01/02/17 2240 01/03/17 0258  TROPONINI 0.49* 0.52* 0.38*   BNP (last 3 results) No results for input(s): PROBNP in the last 8760 hours. HbA1C: No results for input(s): HGBA1C in the last 72  hours. CBG: No results for input(s): GLUCAP in the last 168 hours. Lipid Profile: No results for input(s): CHOL, HDL, LDLCALC, TRIG, CHOLHDL, LDLDIRECT in the last 72 hours. Thyroid Function Tests: Recent Labs    01/02/17 1132  TSH 0.412   Anemia Panel: No results for input(s): VITAMINB12, FOLATE, FERRITIN, TIBC, IRON, RETICCTPCT in the last 72 hours. Sepsis Labs: No results for input(s): PROCALCITON, LATICACIDVEN in the last 168 hours.  No results found for this or any previous visit (from the past 240 hour(s)).       Radiology Studies: Ct Head Wo Contrast  Result Date: 01/02/2017 CLINICAL DATA:  Dizziness. Fall. Struck right side of face on that test. It has tried ight bruising. EXAM: CT HEAD  WITHOUT CONTRAST CT MAXILLOFACIAL WITHOUT CONTRAST CT CERVICAL SPINE WITHOUT CONTRAST TECHNIQUE: Multidetector CT imaging of the head, cervical spine, and maxillofacial structures were performed using the standard protocol without intravenous contrast. Multiplanar CT image reconstructions of the cervical spine and maxillofacial structures were also generated. COMPARISON:  None. FINDINGS: CT HEAD FINDINGS Brain: Global atrophy. No mass effect, midline shift, or acute hemorrhage. No focal acute infarct. Vascular: No hyperdense vessel or unexpected calcification. Skull: The cranium is intact. Other: Noncontributory CT MAXILLOFACIAL FINDINGS Osseous: No acute fracture. No dislocation. No destructive bone lesion. Nasal septum deviated to the right. Orbits: No evidence of vitreous hemorrhage or orbital hemorrhage. Sinuses: Is visualized paranasal sinuses are clear. Mastoid air cells clear. Soft tissues: Airways patent. No obvious soft tissue hematoma. No obvious mass. Mild soft tissue swelling over the lateral orbital rim. It is this correlates with the patient's bruising. CT CERVICAL SPINE FINDINGS Alignment: There is slight anterolisthesis C7 upon T1 related to facet arthropathy. Otherwise anatomic  alignment is preserved. Skull base and vertebrae: Mild wedging of C7 and T1 has a chronic appearance. No obvious acute fracture lines are visualized. Soft tissues and spinal canal: No obvious spinal hematoma. No obvious soft tissue hemorrhage. Thyroid is heterogeneous. 1.3 cm left thyroid hypodensity is noted. No obvious abnormal adenopathy. No evidence of aneurysm. Atherosclerotic calcifications in the bilateral carotid bulbs left greater than right. Disc levels:  C2-3 minimal central bulge and disc osteophyte C3-4: Severe disc narrowing with posterior osteophytes, which reached cord. Severe left foraminal narrowing. Left facet arthropathy is greater than right facet arthropathy C4-5: Posterior osteophytic ridging. Central disc herniation reaches the cord. Severe right foraminal narrowing due to uncovertebral osteophytes. C5-6: Posterior osteophytic ridging, asymmetric to the left reaches the cord. Severe left foraminal narrowing due to uncovertebral osteophytes. C6-7:  Minimal posterior osteophytic ridging.  Foramina patent. C7-T1: On covering of the disc. Grossly patent. Significant bilateral facet arthropathy. Upper thoracic spine discs are unremarkable. Upper chest: No acute abnormality. Other: Noncontributory. IMPRESSION: Head: No acute intracranial pathology.  Atrophy is noted. Face: No acute bony pathology. Mild soft tissue swelling over the lateral right orbit. Cervical spine: No acute bony injury. Advanced degenerative changes as described. Electronically Signed   By: Jolaine ClickArthur  Hoss M.D.   On: 01/02/2017 10:13   Ct Cervical Spine Wo Contrast  Result Date: 01/02/2017 CLINICAL DATA:  Dizziness. Fall. Struck right side of face on that test. It has tried ight bruising. EXAM: CT HEAD WITHOUT CONTRAST CT MAXILLOFACIAL WITHOUT CONTRAST CT CERVICAL SPINE WITHOUT CONTRAST TECHNIQUE: Multidetector CT imaging of the head, cervical spine, and maxillofacial structures were performed using the standard protocol  without intravenous contrast. Multiplanar CT image reconstructions of the cervical spine and maxillofacial structures were also generated. COMPARISON:  None. FINDINGS: CT HEAD FINDINGS Brain: Global atrophy. No mass effect, midline shift, or acute hemorrhage. No focal acute infarct. Vascular: No hyperdense vessel or unexpected calcification. Skull: The cranium is intact. Other: Noncontributory CT MAXILLOFACIAL FINDINGS Osseous: No acute fracture. No dislocation. No destructive bone lesion. Nasal septum deviated to the right. Orbits: No evidence of vitreous hemorrhage or orbital hemorrhage. Sinuses: Is visualized paranasal sinuses are clear. Mastoid air cells clear. Soft tissues: Airways patent. No obvious soft tissue hematoma. No obvious mass. Mild soft tissue swelling over the lateral orbital rim. It is this correlates with the patient's bruising. CT CERVICAL SPINE FINDINGS Alignment: There is slight anterolisthesis C7 upon T1 related to facet arthropathy. Otherwise anatomic alignment is preserved. Skull base and vertebrae: Mild wedging of  C7 and T1 has a chronic appearance. No obvious acute fracture lines are visualized. Soft tissues and spinal canal: No obvious spinal hematoma. No obvious soft tissue hemorrhage. Thyroid is heterogeneous. 1.3 cm left thyroid hypodensity is noted. No obvious abnormal adenopathy. No evidence of aneurysm. Atherosclerotic calcifications in the bilateral carotid bulbs left greater than right. Disc levels:  C2-3 minimal central bulge and disc osteophyte C3-4: Severe disc narrowing with posterior osteophytes, which reached cord. Severe left foraminal narrowing. Left facet arthropathy is greater than right facet arthropathy C4-5: Posterior osteophytic ridging. Central disc herniation reaches the cord. Severe right foraminal narrowing due to uncovertebral osteophytes. C5-6: Posterior osteophytic ridging, asymmetric to the left reaches the cord. Severe left foraminal narrowing due to  uncovertebral osteophytes. C6-7:  Minimal posterior osteophytic ridging.  Foramina patent. C7-T1: On covering of the disc. Grossly patent. Significant bilateral facet arthropathy. Upper thoracic spine discs are unremarkable. Upper chest: No acute abnormality. Other: Noncontributory. IMPRESSION: Head: No acute intracranial pathology.  Atrophy is noted. Face: No acute bony pathology. Mild soft tissue swelling over the lateral right orbit. Cervical spine: No acute bony injury. Advanced degenerative changes as described. Electronically Signed   By: Jolaine Click M.D.   On: 01/02/2017 10:13   Dg Chest Portable 1 View  Result Date: 01/02/2017 CLINICAL DATA:  Chest pain with shortness of breath EXAM: PORTABLE CHEST 1 VIEW COMPARISON:  None. FINDINGS: There is no edema or consolidation. The heart size and pulmonary vascularity are normal. No adenopathy. There is aortic atherosclerosis. No pneumothorax. There is degenerative change in the right shoulder and in the thoracic spine. IMPRESSION: Aortic atherosclerosis.  No edema or consolidation. Aortic Atherosclerosis (ICD10-I70.0). Electronically Signed   By: Bretta Bang III M.D.   On: 01/02/2017 08:49   Ct Maxillofacial Wo Contrast  Result Date: 01/02/2017 CLINICAL DATA:  Dizziness. Fall. Struck right side of face on that test. It has tried ight bruising. EXAM: CT HEAD WITHOUT CONTRAST CT MAXILLOFACIAL WITHOUT CONTRAST CT CERVICAL SPINE WITHOUT CONTRAST TECHNIQUE: Multidetector CT imaging of the head, cervical spine, and maxillofacial structures were performed using the standard protocol without intravenous contrast. Multiplanar CT image reconstructions of the cervical spine and maxillofacial structures were also generated. COMPARISON:  None. FINDINGS: CT HEAD FINDINGS Brain: Global atrophy. No mass effect, midline shift, or acute hemorrhage. No focal acute infarct. Vascular: No hyperdense vessel or unexpected calcification. Skull: The cranium is intact. Other:  Noncontributory CT MAXILLOFACIAL FINDINGS Osseous: No acute fracture. No dislocation. No destructive bone lesion. Nasal septum deviated to the right. Orbits: No evidence of vitreous hemorrhage or orbital hemorrhage. Sinuses: Is visualized paranasal sinuses are clear. Mastoid air cells clear. Soft tissues: Airways patent. No obvious soft tissue hematoma. No obvious mass. Mild soft tissue swelling over the lateral orbital rim. It is this correlates with the patient's bruising. CT CERVICAL SPINE FINDINGS Alignment: There is slight anterolisthesis C7 upon T1 related to facet arthropathy. Otherwise anatomic alignment is preserved. Skull base and vertebrae: Mild wedging of C7 and T1 has a chronic appearance. No obvious acute fracture lines are visualized. Soft tissues and spinal canal: No obvious spinal hematoma. No obvious soft tissue hemorrhage. Thyroid is heterogeneous. 1.3 cm left thyroid hypodensity is noted. No obvious abnormal adenopathy. No evidence of aneurysm. Atherosclerotic calcifications in the bilateral carotid bulbs left greater than right. Disc levels:  C2-3 minimal central bulge and disc osteophyte C3-4: Severe disc narrowing with posterior osteophytes, which reached cord. Severe left foraminal narrowing. Left facet arthropathy is greater than right facet  arthropathy C4-5: Posterior osteophytic ridging. Central disc herniation reaches the cord. Severe right foraminal narrowing due to uncovertebral osteophytes. C5-6: Posterior osteophytic ridging, asymmetric to the left reaches the cord. Severe left foraminal narrowing due to uncovertebral osteophytes. C6-7:  Minimal posterior osteophytic ridging.  Foramina patent. C7-T1: On covering of the disc. Grossly patent. Significant bilateral facet arthropathy. Upper thoracic spine discs are unremarkable. Upper chest: No acute abnormality. Other: Noncontributory. IMPRESSION: Head: No acute intracranial pathology.  Atrophy is noted. Face: No acute bony pathology.  Mild soft tissue swelling over the lateral right orbit. Cervical spine: No acute bony injury. Advanced degenerative changes as described. Electronically Signed   By: Jolaine Click M.D.   On: 01/02/2017 10:13        Scheduled Meds: . amiodarone  400 mg Oral BID  . atorvastatin  20 mg Oral Daily  . escitalopram  10 mg Oral Daily  . pantoprazole (PROTONIX) IV  40 mg Intravenous Q12H  . senna  1 tablet Oral BID  . sodium chloride flush  3 mL Intravenous Q12H   Continuous Infusions: . sodium chloride    . magnesium sulfate 1 - 4 g bolus IVPB       LOS: 1 day     Alwyn Ren, MD [facility]   If 7PM-7AM, please contact night-coverage [email or web address] Password [email or web address] 01/03/2017, 9:53 AM

## 2017-01-03 NOTE — Progress Notes (Addendum)
Pt. Walked to the bathroom had black stool, daughter at the bedside. Pt returned to bed c/o chest pain 5/10. Pt. uncontrolable tremors, Dr. Wyline MoodBranch on the unit notified of pain EKG obtained results given to Dr. Wyline MoodBranch. Also notified Dr. Jerolyn CenterMathews of chest pain drop in hgb. Orders to continue with fluids, transfuse 2 units of PRBCs.

## 2017-01-03 NOTE — Progress Notes (Signed)
  Echocardiogram 2D Echocardiogram has been performed.  Celene SkeenVijay  Reid Regas 01/03/2017, 12:52 PM

## 2017-01-03 NOTE — Progress Notes (Signed)
Progress Note  Patient Name: Stephen Crawford Date of Encounter: 01/03/2017   Subjective   Recurrent episode of syncope this morning with standing.   Inpatient Medications    Scheduled Meds: . amiodarone  400 mg Oral BID  . apixaban  5 mg Oral BID  . aspirin EC  81 mg Oral Daily  . atorvastatin  20 mg Oral Daily  . escitalopram  10 mg Oral Daily  . senna  1 tablet Oral BID  . sodium chloride flush  3 mL Intravenous Q12H   Continuous Infusions: . sodium chloride     PRN Meds: sodium chloride, acetaminophen **OR** acetaminophen, albuterol, alum & mag hydroxide-simeth, ondansetron **OR** ondansetron (ZOFRAN) IV, polyethylene glycol, sodium chloride flush, traZODone   Vital Signs    Vitals:   01/03/17 0029 01/03/17 0300 01/03/17 0315 01/03/17 0810  BP: 127/67 126/71 122/75 123/70  Pulse: 74 72 86 (!) 102  Resp: 17 17 16 20   Temp: 99.4 F (37.4 C) 98.6 F (37 C) 98.6 F (37 C) 98.4 F (36.9 C)  TempSrc: Oral Oral Oral Oral  SpO2: 98% 98% 98% 98%  Weight:   162 lb 4.8 oz (73.6 kg)   Height:        Intake/Output Summary (Last 24 hours) at 01/03/2017 0842 Last data filed at 01/03/2017 0827 Gross per 24 hour  Intake 600 ml  Output 1400 ml  Net -800 ml   Filed Weights   01/02/17 0820 01/03/17 0315  Weight: 177 lb (80.3 kg) 162 lb 4.8 oz (73.6 kg)    Telemetry    SR and sinus tach - Personally Reviewed  ECG   Physical Exam   GEN: No acute distress.   Neck: No JVD Cardiac: RRR, no murmurs, rubs, or gallops.  Respiratory: Clear to auscultation bilaterally. GI: Soft, nontender, non-distended  MS: No edema; No deformity. Neuro:  Nonfocal  Psych: Normal affect   Labs    Chemistry Recent Labs  Lab 01/02/17 0823 01/02/17 1558 01/03/17 0258  NA 133* 138 138  K 4.5 4.1 4.1  CL 106 113* 113*  CO2 16* 21* 19*  GLUCOSE 122* 106* 110*  BUN 82* 75* 70*  CREATININE 1.24 1.11 1.17  CALCIUM 9.0 8.3* 8.2*  GFRNONAA 56* >60 >60  GFRAA >60 >60 >60    ANIONGAP 11 4* 6     Hematology Recent Labs  Lab 01/02/17 0823 01/03/17 0258  WBC 19.2* 11.1*  RBC 3.79* 2.58*  HGB 13.3 9.3*  HCT 39.0 26.6*  MCV 102.9* 103.1*  MCH 35.1* 36.0*  MCHC 34.1 35.0  RDW 14.0 14.6  PLT 211 168    Cardiac Enzymes Recent Labs  Lab 01/02/17 1558 01/02/17 2240 01/03/17 0258  TROPONINI 0.49* 0.52* 0.38*    Recent Labs  Lab 01/02/17 0826  TROPIPOC 0.05     BNPNo results for input(s): BNP, PROBNP in the last 168 hours.   DDimer No results for input(s): DDIMER in the last 168 hours.   Radiology    Ct Head Wo Contrast  Result Date: 01/02/2017 CLINICAL DATA:  Dizziness. Fall. Struck right side of face on that test. It has tried ight bruising. EXAM: CT HEAD WITHOUT CONTRAST CT MAXILLOFACIAL WITHOUT CONTRAST CT CERVICAL SPINE WITHOUT CONTRAST TECHNIQUE: Multidetector CT imaging of the head, cervical spine, and maxillofacial structures were performed using the standard protocol without intravenous contrast. Multiplanar CT image reconstructions of the cervical spine and maxillofacial structures were also generated. COMPARISON:  None. FINDINGS: CT HEAD FINDINGS Brain: Global atrophy.  No mass effect, midline shift, or acute hemorrhage. No focal acute infarct. Vascular: No hyperdense vessel or unexpected calcification. Skull: The cranium is intact. Other: Noncontributory CT MAXILLOFACIAL FINDINGS Osseous: No acute fracture. No dislocation. No destructive bone lesion. Nasal septum deviated to the right. Orbits: No evidence of vitreous hemorrhage or orbital hemorrhage. Sinuses: Is visualized paranasal sinuses are clear. Mastoid air cells clear. Soft tissues: Airways patent. No obvious soft tissue hematoma. No obvious mass. Mild soft tissue swelling over the lateral orbital rim. It is this correlates with the patient's bruising. CT CERVICAL SPINE FINDINGS Alignment: There is slight anterolisthesis C7 upon T1 related to facet arthropathy. Otherwise anatomic  alignment is preserved. Skull base and vertebrae: Mild wedging of C7 and T1 has a chronic appearance. No obvious acute fracture lines are visualized. Soft tissues and spinal canal: No obvious spinal hematoma. No obvious soft tissue hemorrhage. Thyroid is heterogeneous. 1.3 cm left thyroid hypodensity is noted. No obvious abnormal adenopathy. No evidence of aneurysm. Atherosclerotic calcifications in the bilateral carotid bulbs left greater than right. Disc levels:  C2-3 minimal central bulge and disc osteophyte C3-4: Severe disc narrowing with posterior osteophytes, which reached cord. Severe left foraminal narrowing. Left facet arthropathy is greater than right facet arthropathy C4-5: Posterior osteophytic ridging. Central disc herniation reaches the cord. Severe right foraminal narrowing due to uncovertebral osteophytes. C5-6: Posterior osteophytic ridging, asymmetric to the left reaches the cord. Severe left foraminal narrowing due to uncovertebral osteophytes. C6-7:  Minimal posterior osteophytic ridging.  Foramina patent. C7-T1: On covering of the disc. Grossly patent. Significant bilateral facet arthropathy. Upper thoracic spine discs are unremarkable. Upper chest: No acute abnormality. Other: Noncontributory. IMPRESSION: Head: No acute intracranial pathology.  Atrophy is noted. Face: No acute bony pathology. Mild soft tissue swelling over the lateral right orbit. Cervical spine: No acute bony injury. Advanced degenerative changes as described. Electronically Signed   By: Jolaine Click M.D.   On: 01/02/2017 10:13   Ct Cervical Spine Wo Contrast  Result Date: 01/02/2017 CLINICAL DATA:  Dizziness. Fall. Struck right side of face on that test. It has tried ight bruising. EXAM: CT HEAD WITHOUT CONTRAST CT MAXILLOFACIAL WITHOUT CONTRAST CT CERVICAL SPINE WITHOUT CONTRAST TECHNIQUE: Multidetector CT imaging of the head, cervical spine, and maxillofacial structures were performed using the standard protocol  without intravenous contrast. Multiplanar CT image reconstructions of the cervical spine and maxillofacial structures were also generated. COMPARISON:  None. FINDINGS: CT HEAD FINDINGS Brain: Global atrophy. No mass effect, midline shift, or acute hemorrhage. No focal acute infarct. Vascular: No hyperdense vessel or unexpected calcification. Skull: The cranium is intact. Other: Noncontributory CT MAXILLOFACIAL FINDINGS Osseous: No acute fracture. No dislocation. No destructive bone lesion. Nasal septum deviated to the right. Orbits: No evidence of vitreous hemorrhage or orbital hemorrhage. Sinuses: Is visualized paranasal sinuses are clear. Mastoid air cells clear. Soft tissues: Airways patent. No obvious soft tissue hematoma. No obvious mass. Mild soft tissue swelling over the lateral orbital rim. It is this correlates with the patient's bruising. CT CERVICAL SPINE FINDINGS Alignment: There is slight anterolisthesis C7 upon T1 related to facet arthropathy. Otherwise anatomic alignment is preserved. Skull base and vertebrae: Mild wedging of C7 and T1 has a chronic appearance. No obvious acute fracture lines are visualized. Soft tissues and spinal canal: No obvious spinal hematoma. No obvious soft tissue hemorrhage. Thyroid is heterogeneous. 1.3 cm left thyroid hypodensity is noted. No obvious abnormal adenopathy. No evidence of aneurysm. Atherosclerotic calcifications in the bilateral carotid bulbs left greater than  right. Disc levels:  C2-3 minimal central bulge and disc osteophyte C3-4: Severe disc narrowing with posterior osteophytes, which reached cord. Severe left foraminal narrowing. Left facet arthropathy is greater than right facet arthropathy C4-5: Posterior osteophytic ridging. Central disc herniation reaches the cord. Severe right foraminal narrowing due to uncovertebral osteophytes. C5-6: Posterior osteophytic ridging, asymmetric to the left reaches the cord. Severe left foraminal narrowing due to  uncovertebral osteophytes. C6-7:  Minimal posterior osteophytic ridging.  Foramina patent. C7-T1: On covering of the disc. Grossly patent. Significant bilateral facet arthropathy. Upper thoracic spine discs are unremarkable. Upper chest: No acute abnormality. Other: Noncontributory. IMPRESSION: Head: No acute intracranial pathology.  Atrophy is noted. Face: No acute bony pathology. Mild soft tissue swelling over the lateral right orbit. Cervical spine: No acute bony injury. Advanced degenerative changes as described. Electronically Signed   By: Jolaine ClickArthur  Hoss M.D.   On: 01/02/2017 10:13   Dg Chest Portable 1 View  Result Date: 01/02/2017 CLINICAL DATA:  Chest pain with shortness of breath EXAM: PORTABLE CHEST 1 VIEW COMPARISON:  None. FINDINGS: There is no edema or consolidation. The heart size and pulmonary vascularity are normal. No adenopathy. There is aortic atherosclerosis. No pneumothorax. There is degenerative change in the right shoulder and in the thoracic spine. IMPRESSION: Aortic atherosclerosis.  No edema or consolidation. Aortic Atherosclerosis (ICD10-I70.0). Electronically Signed   By: Bretta BangWilliam  Woodruff III M.D.   On: 01/02/2017 08:49   Ct Maxillofacial Wo Contrast  Result Date: 01/02/2017 CLINICAL DATA:  Dizziness. Fall. Struck right side of face on that test. It has tried ight bruising. EXAM: CT HEAD WITHOUT CONTRAST CT MAXILLOFACIAL WITHOUT CONTRAST CT CERVICAL SPINE WITHOUT CONTRAST TECHNIQUE: Multidetector CT imaging of the head, cervical spine, and maxillofacial structures were performed using the standard protocol without intravenous contrast. Multiplanar CT image reconstructions of the cervical spine and maxillofacial structures were also generated. COMPARISON:  None. FINDINGS: CT HEAD FINDINGS Brain: Global atrophy. No mass effect, midline shift, or acute hemorrhage. No focal acute infarct. Vascular: No hyperdense vessel or unexpected calcification. Skull: The cranium is intact. Other:  Noncontributory CT MAXILLOFACIAL FINDINGS Osseous: No acute fracture. No dislocation. No destructive bone lesion. Nasal septum deviated to the right. Orbits: No evidence of vitreous hemorrhage or orbital hemorrhage. Sinuses: Is visualized paranasal sinuses are clear. Mastoid air cells clear. Soft tissues: Airways patent. No obvious soft tissue hematoma. No obvious mass. Mild soft tissue swelling over the lateral orbital rim. It is this correlates with the patient's bruising. CT CERVICAL SPINE FINDINGS Alignment: There is slight anterolisthesis C7 upon T1 related to facet arthropathy. Otherwise anatomic alignment is preserved. Skull base and vertebrae: Mild wedging of C7 and T1 has a chronic appearance. No obvious acute fracture lines are visualized. Soft tissues and spinal canal: No obvious spinal hematoma. No obvious soft tissue hemorrhage. Thyroid is heterogeneous. 1.3 cm left thyroid hypodensity is noted. No obvious abnormal adenopathy. No evidence of aneurysm. Atherosclerotic calcifications in the bilateral carotid bulbs left greater than right. Disc levels:  C2-3 minimal central bulge and disc osteophyte C3-4: Severe disc narrowing with posterior osteophytes, which reached cord. Severe left foraminal narrowing. Left facet arthropathy is greater than right facet arthropathy C4-5: Posterior osteophytic ridging. Central disc herniation reaches the cord. Severe right foraminal narrowing due to uncovertebral osteophytes. C5-6: Posterior osteophytic ridging, asymmetric to the left reaches the cord. Severe left foraminal narrowing due to uncovertebral osteophytes. C6-7:  Minimal posterior osteophytic ridging.  Foramina patent. C7-T1: On covering of the disc. Grossly patent. Significant  bilateral facet arthropathy. Upper thoracic spine discs are unremarkable. Upper chest: No acute abnormality. Other: Noncontributory. IMPRESSION: Head: No acute intracranial pathology.  Atrophy is noted. Face: No acute bony pathology.  Mild soft tissue swelling over the lateral right orbit. Cervical spine: No acute bony injury. Advanced degenerative changes as described. Electronically Signed   By: Jolaine ClickArthur  Hoss M.D.   On: 01/02/2017 10:13    Cardiac Studies     Patient Profile     71 y.o. male with history of CAD admitted with new onset afib and syncope  Assessment & Plan    1. Afib - new diagnosis this admit - started on amiodarone this admission by EP - started on eliquis for stroke prevention. Drop in Hgb overnight I suspect due to hemoconcentration as all his cell lines dropped, will however hold eliquis for the time being.   2. Syncope - occurred in setting of afib with RVR. Thought possible component of postmicturition syncope.  - patient is also severely orthostatic, SBP 123 lying down to 80 with standing with 20 beat increase in HR. Recurrent episode of syncope with standing to be weighed in hospital. Follow orthostatics with fluid resuscitation. Norvasc and lopressor are on hold. Unclear if will need midodrine or florinef, f/u response to IVFs alone  3. CAD - [place] acute issues.  - suspect mild uptrend and downtrend in troponin rate related.   4. Uremia - per primary team  5. Leukocytosis - per primary team  6. Anemia - Hgb 13.3 to 9.3, notably all his cell lines decreased, I suspect he was hemoconcentrated. We will hold eliqius for now but resume if Hgb stable   For questions or updates, please contact [facility] Please consult [email or web address] for contact info under Cardiology/STEMI.      Joanie CoddingtonSigned, Branch, Jonathan, MD  01/03/2017, 8:42 AM

## 2017-01-03 NOTE — Progress Notes (Addendum)
Patient with sudden onset midchest pain 8/10 this afternoon, +SOB. Worst with palpation. Feels shaky and trembly. 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 His troponin had been trending down on admission, thought to be rate related. Noticebaly his Hgb has been trending down 13.3-->9.3-->7.9. He was severely orthostatic this morning. He reports dark appearing stools. Stable O2 sats. Eliquis on hold this was started just recently for new diagnosis of afib.   We will try sublingual NG to see if helps with symptoms, though not clear at this time its cardiac. Agree with transfer to stepdown along with blood transfusion and GI consult   Dina RichJonathan Arthi Mcdonald MD

## 2017-01-03 NOTE — Progress Notes (Signed)
Patient had syncopal episode after standing to be weighed. Vital signs stable and no changes on telemetry. Patient remained in sinus rhythm. On call MD made aware. No new orders.

## 2017-01-03 NOTE — Progress Notes (Signed)
Pt transferred to 6E report given. Family at the bedside.

## 2017-01-03 NOTE — Consult Note (Signed)
[facility] Consultation Note  Referring Provider: Dr. Jerelene ReddenElizabeth Crawford Primary Care Physician:  System, Pcp Not In  Reason for Consultation:  Melena, anemia  HPI: Stephen Crawford is a 71 y.o. male admitted after syncopal event.  Has atrial fibrillation and has been on aspirin and clopdigrel (which were transitioned to apixaban).  Began having some black stools yesterday; no hematemesis.  No abdominal pain, change in bowel habits.  No prior GI bleeding.  Had endoscopy and colonoscopy few years ago in [place] which he states were normal (we don't have these records).   Past Medical History:  Diagnosis Date  . CAD (coronary artery disease) 01/02/2017    Past Surgical History:  Procedure Laterality Date  . CORONARY ANGIOPLASTY N/A 01/02/2017    Prior to Admission medications   Medication Sig Start Date End Date Taking? Authorizing Provider  amLODipine (NORVASC) 5 MG tablet Take 5 mg daily by mouth.   Yes [provider]  aspirin EC 81 MG tablet Take 81 mg daily by mouth.   Yes [provider]  atorvastatin (LIPITOR) 20 MG tablet Take 20 mg daily by mouth.   Yes [provider]  clopidogrel (PLAVIX) 75 MG tablet Take 75 mg daily by mouth.   Yes [provider]  escitalopram (LEXAPRO) 10 MG tablet Take 10 mg daily by mouth.   Yes [provider]  meloxicam (MOBIC) 7.5 MG tablet Take 7.5 mg daily by mouth.   Yes [provider]  metoprolol tartrate (LOPRESSOR) 25 MG tablet Take 25 mg 2 (two) times daily by mouth.   Yes [provider]  traZODone (DESYREL) 150 MG tablet Take 75 mg daily as needed by mouth for sleep.    Yes [provider]    Current Facility-Administered Medications  Medication Dose Route Frequency Provider Last Rate Last Dose  . 0.9 %  sodium chloride infusion  250 mL Intravenous PRN Emokpae, Courage, MD      . 0.9 %  sodium chloride infusion   Intravenous Continuous Alwyn RenMathews, Stephen G,  MD      . 0.9 %  sodium chloride infusion   Intravenous Once Alwyn RenMathews, Stephen G, MD      . acetaminophen (TYLENOL) tablet 650 mg  650 mg Oral Q6H PRN Shon HaleEmokpae, Courage, MD       Or  . acetaminophen (TYLENOL) suppository 650 mg  650 mg Rectal Q6H PRN Emokpae, Courage, MD      . albuterol (PROVENTIL) (2.5 MG/3ML) 0.083% nebulizer solution 2.5 mg  2.5 mg Nebulization Q2H PRN Emokpae, Courage, MD      . alum & mag hydroxide-simeth (MAALOX/MYLANTA) 200-200-20 MG/5ML suspension 30 mL  30 mL Oral Q6H PRN Emokpae, Courage, MD   30 mL at 01/02/17 1530  . amiodarone (PACERONE) tablet 400 mg  400 mg Oral BID Duke SalviaKlein, Steven C, MD   400 mg at 01/03/17 0915  . atorvastatin (LIPITOR) tablet 20 mg  20 mg Oral Daily Emokpae, Courage, MD   20 mg at 01/03/17 0915  . escitalopram (LEXAPRO) tablet 10 mg  10 mg Oral Daily Emokpae, Courage, MD   10 mg at 01/03/17 0915  . nitroGLYCERIN (NITROSTAT) SL tablet 0.4 mg  0.4 mg Sublingual Q5 min PRN Antoine PocheBranch, Jonathan F, MD   0.4 mg at 01/03/17 1137  . ondansetron (ZOFRAN) tablet 4 mg  4 mg Oral Q6H PRN Emokpae, Courage, MD       Or  . ondansetron (ZOFRAN) injection 4 mg  4 mg Intravenous Q6H PRN Emokpae,  Courage, MD      . pantoprazole (PROTONIX) injection 40 mg  40 mg Intravenous Q12H Alwyn Ren, MD   40 mg at 01/03/17 1112  . polyethylene glycol (MIRALAX / GLYCOLAX) packet 17 g  17 g Oral Daily PRN Emokpae, Courage, MD      . senna (SENOKOT) tablet 8.6 mg  1 tablet Oral BID Emokpae, Courage, MD   8.6 mg at 01/03/17 0915  . sodium chloride flush (NS) 0.9 % injection 3 mL  3 mL Intravenous Q12H Emokpae, Courage, MD   3 mL at 01/03/17 0916  . sodium chloride flush (NS) 0.9 % injection 3 mL  3 mL Intravenous PRN Shon Hale, MD        Allergies as of 01/02/2017  . (No Known Allergies)    Family History  Problem Relation Age of Onset  . Hypertension Father     Social History   Socioeconomic History  . Marital status: Widowed    Spouse name: Not on  file  . Number of children: Not on file  . Years of education: Not on file  . Highest education level: Not on file  Social Needs  . Financial resource strain: Not on file  . Food insecurity - worry: Not on file  . Food insecurity - inability: Not on file  . Transportation needs - medical: Not on file  . Transportation needs - non-medical: Not on file  Occupational History  . Not on file  Tobacco Use  . Smoking status: Never Smoker  . Smokeless tobacco: Never Used  . Tobacco comment: not currently smoking  Substance and Sexual Activity  . Alcohol use: No    Frequency: Never  . Drug use: No  . Sexual activity: Not on file  Other Topics Concern  . Not on file  Social History Narrative  . Not on file    Review of Systems: As per HPI, all others negative  Physical Exam: Vital signs in last 24 hours: Temp:  [97.7 F (36.5 C)-99.8 F (37.7 C)] 98.1 F (36.7 C) (11/11 1111) Pulse Rate:  [72-102] 101 (11/11 1111) Resp:  [11-22] 20 (11/11 1111) BP: (93-151)/(57-78) 106/58 (11/11 1159) SpO2:  [93 %-100 %] 98 % (11/11 0810) Weight:  [162 lb 4.8 oz (73.6 kg)] 162 lb 4.8 oz (73.6 kg) (11/11 0315) Last BM Date: 01/02/17 General:   Alert, chronically ill-appearing, somewhat cachectic-appearing, NAD Head:  Normocephalic and atraumatic. Eyes:  Sclera clear, no icterus.   Conjunctiva pale Ears:  Normal auditory acuity. Nose:  No deformity, discharge,  or lesions. Mouth:  No deformity or lesions.  Oropharynx pale and dry. Neck:  Supple; no masses or thyromegaly. Lungs:  Clear throughout to auscultation.   No wheezes, crackles, or rhonchi. No acute distress. Heart:  Irregular, significantly tachycardic; no murmurs, clicks, rubs,  or gallops. Abdomen:  Soft, nontender and nondistended. No masses, hepatosplenomegaly or hernias noted. Normal bowel sounds, without guarding, and without rebound.     Msk:  Symmetrical without gross deformities. Normal posture. Pulses:  Normal pulses noted,  bounding and tachycardic Extremities:  Without clubbing; trace bilateral ankle edema Neurologic:  Alert and  oriented x4; diffusely weak, otherwise grossly normal neurologically. Skin:  Right "black eye", scattered bruising on his arms and legs (worse right side, where trauma from fall was), otherwise intact without significant lesions or rashes. Cervical Nodes:  No significant cervical adenopathy. Psych:  Alert and cooperative. Normal mood and affect.   Lab Results: Recent Labs  01/02/17 0823 01/03/17 0258 01/03/17 1030  WBC 19.2* 11.1*  --   HGB 13.3 9.3* 7.9*  HCT 39.0 26.6* 23.3*  PLT 211 168  --    BMET Recent Labs    01/02/17 0823 01/02/17 1558 01/03/17 0258  NA 133* 138 138  K 4.5 4.1 4.1  CL 106 113* 113*  CO2 16* 21* 19*  GLUCOSE 122* 106* 110*  BUN 82* 75* 70*  CREATININE 1.24 1.11 1.17  CALCIUM 9.0 8.3* 8.2*   LFT No results for input(s): PROT, ALBUMIN, AST, ALT, ALKPHOS, BILITOT, BILIDIR, IBILI in the last 72 hours. PT/INR Recent Labs    01/02/17 0823 01/03/17 1030  LABPROT 13.9 15.8*  INR 1.08 1.27    Studies/Results: Ct Head Wo Contrast  Result Date: 01/02/2017 CLINICAL DATA:  Dizziness. Fall. Struck right side of face on that test. It has tried ight bruising. EXAM: CT HEAD WITHOUT CONTRAST CT MAXILLOFACIAL WITHOUT CONTRAST CT CERVICAL SPINE WITHOUT CONTRAST TECHNIQUE: Multidetector CT imaging of the head, cervical spine, and maxillofacial structures were performed using the standard protocol without intravenous contrast. Multiplanar CT image reconstructions of the cervical spine and maxillofacial structures were also generated. COMPARISON:  None. FINDINGS: CT HEAD FINDINGS Brain: Global atrophy. No mass effect, midline shift, or acute hemorrhage. No focal acute infarct. Vascular: No hyperdense vessel or unexpected calcification. Skull: The cranium is intact. Other: Noncontributory CT MAXILLOFACIAL FINDINGS Osseous: No acute fracture. No dislocation.  No destructive bone lesion. Nasal septum deviated to the right. Orbits: No evidence of vitreous hemorrhage or orbital hemorrhage. Sinuses: Is visualized paranasal sinuses are clear. Mastoid air cells clear. Soft tissues: Airways patent. No obvious soft tissue hematoma. No obvious mass. Mild soft tissue swelling over the lateral orbital rim. It is this correlates with the patient's bruising. CT CERVICAL SPINE FINDINGS Alignment: There is slight anterolisthesis C7 upon T1 related to facet arthropathy. Otherwise anatomic alignment is preserved. Skull base and vertebrae: Mild wedging of C7 and T1 has a chronic appearance. No obvious acute fracture lines are visualized. Soft tissues and spinal canal: No obvious spinal hematoma. No obvious soft tissue hemorrhage. Thyroid is heterogeneous. 1.3 cm left thyroid hypodensity is noted. No obvious abnormal adenopathy. No evidence of aneurysm. Atherosclerotic calcifications in the bilateral carotid bulbs left greater than right. Disc levels:  C2-3 minimal central bulge and disc osteophyte C3-4: Severe disc narrowing with posterior osteophytes, which reached cord. Severe left foraminal narrowing. Left facet arthropathy is greater than right facet arthropathy C4-5: Posterior osteophytic ridging. Central disc herniation reaches the cord. Severe right foraminal narrowing due to uncovertebral osteophytes. C5-6: Posterior osteophytic ridging, asymmetric to the left reaches the cord. Severe left foraminal narrowing due to uncovertebral osteophytes. C6-7:  Minimal posterior osteophytic ridging.  Foramina patent. C7-T1: On covering of the disc. Grossly patent. Significant bilateral facet arthropathy. Upper thoracic spine discs are unremarkable. Upper chest: No acute abnormality. Other: Noncontributory. IMPRESSION: Head: No acute intracranial pathology.  Atrophy is noted. Face: No acute bony pathology. Mild soft tissue swelling over the lateral right orbit. Cervical spine: No acute bony  injury. Advanced degenerative changes as described. Electronically Signed   By: Jolaine Click M.D.   On: 01/02/2017 10:13   Ct Cervical Spine Wo Contrast  Result Date: 01/02/2017 CLINICAL DATA:  Dizziness. Fall. Struck right side of face on that test. It has tried ight bruising. EXAM: CT HEAD WITHOUT CONTRAST CT MAXILLOFACIAL WITHOUT CONTRAST CT CERVICAL SPINE WITHOUT CONTRAST TECHNIQUE: Multidetector CT imaging of the head, cervical spine, and maxillofacial structures  were performed using the standard protocol without intravenous contrast. Multiplanar CT image reconstructions of the cervical spine and maxillofacial structures were also generated. COMPARISON:  None. FINDINGS: CT HEAD FINDINGS Brain: Global atrophy. No mass effect, midline shift, or acute hemorrhage. No focal acute infarct. Vascular: No hyperdense vessel or unexpected calcification. Skull: The cranium is intact. Other: Noncontributory CT MAXILLOFACIAL FINDINGS Osseous: No acute fracture. No dislocation. No destructive bone lesion. Nasal septum deviated to the right. Orbits: No evidence of vitreous hemorrhage or orbital hemorrhage. Sinuses: Is visualized paranasal sinuses are clear. Mastoid air cells clear. Soft tissues: Airways patent. No obvious soft tissue hematoma. No obvious mass. Mild soft tissue swelling over the lateral orbital rim. It is this correlates with the patient's bruising. CT CERVICAL SPINE FINDINGS Alignment: There is slight anterolisthesis C7 upon T1 related to facet arthropathy. Otherwise anatomic alignment is preserved. Skull base and vertebrae: Mild wedging of C7 and T1 has a chronic appearance. No obvious acute fracture lines are visualized. Soft tissues and spinal canal: No obvious spinal hematoma. No obvious soft tissue hemorrhage. Thyroid is heterogeneous. 1.3 cm left thyroid hypodensity is noted. No obvious abnormal adenopathy. No evidence of aneurysm. Atherosclerotic calcifications in the bilateral carotid bulbs left  greater than right. Disc levels:  C2-3 minimal central bulge and disc osteophyte C3-4: Severe disc narrowing with posterior osteophytes, which reached cord. Severe left foraminal narrowing. Left facet arthropathy is greater than right facet arthropathy C4-5: Posterior osteophytic ridging. Central disc herniation reaches the cord. Severe right foraminal narrowing due to uncovertebral osteophytes. C5-6: Posterior osteophytic ridging, asymmetric to the left reaches the cord. Severe left foraminal narrowing due to uncovertebral osteophytes. C6-7:  Minimal posterior osteophytic ridging.  Foramina patent. C7-T1: On covering of the disc. Grossly patent. Significant bilateral facet arthropathy. Upper thoracic spine discs are unremarkable. Upper chest: No acute abnormality. Other: Noncontributory. IMPRESSION: Head: No acute intracranial pathology.  Atrophy is noted. Face: No acute bony pathology. Mild soft tissue swelling over the lateral right orbit. Cervical spine: No acute bony injury. Advanced degenerative changes as described. Electronically Signed   By: Jolaine Click M.D.   On: 01/02/2017 10:13   Dg Chest Portable 1 View  Result Date: 01/02/2017 CLINICAL DATA:  Chest pain with shortness of breath EXAM: PORTABLE CHEST 1 VIEW COMPARISON:  None. FINDINGS: There is no edema or consolidation. The heart size and pulmonary vascularity are normal. No adenopathy. There is aortic atherosclerosis. No pneumothorax. There is degenerative change in the right shoulder and in the thoracic spine. IMPRESSION: Aortic atherosclerosis.  No edema or consolidation. Aortic Atherosclerosis (ICD10-I70.0). Electronically Signed   By: Bretta Bang III M.D.   On: 01/02/2017 08:49   Ct Maxillofacial Wo Contrast  Result Date: 01/02/2017 CLINICAL DATA:  Dizziness. Fall. Struck right side of face on that test. It has tried ight bruising. EXAM: CT HEAD WITHOUT CONTRAST CT MAXILLOFACIAL WITHOUT CONTRAST CT CERVICAL SPINE WITHOUT CONTRAST  TECHNIQUE: Multidetector CT imaging of the head, cervical spine, and maxillofacial structures were performed using the standard protocol without intravenous contrast. Multiplanar CT image reconstructions of the cervical spine and maxillofacial structures were also generated. COMPARISON:  None. FINDINGS: CT HEAD FINDINGS Brain: Global atrophy. No mass effect, midline shift, or acute hemorrhage. No focal acute infarct. Vascular: No hyperdense vessel or unexpected calcification. Skull: The cranium is intact. Other: Noncontributory CT MAXILLOFACIAL FINDINGS Osseous: No acute fracture. No dislocation. No destructive bone lesion. Nasal septum deviated to the right. Orbits: No evidence of vitreous hemorrhage or orbital hemorrhage. Sinuses: Is  visualized paranasal sinuses are clear. Mastoid air cells clear. Soft tissues: Airways patent. No obvious soft tissue hematoma. No obvious mass. Mild soft tissue swelling over the lateral orbital rim. It is this correlates with the patient's bruising. CT CERVICAL SPINE FINDINGS Alignment: There is slight anterolisthesis C7 upon T1 related to facet arthropathy. Otherwise anatomic alignment is preserved. Skull base and vertebrae: Mild wedging of C7 and T1 has a chronic appearance. No obvious acute fracture lines are visualized. Soft tissues and spinal canal: No obvious spinal hematoma. No obvious soft tissue hemorrhage. Thyroid is heterogeneous. 1.3 cm left thyroid hypodensity is noted. No obvious abnormal adenopathy. No evidence of aneurysm. Atherosclerotic calcifications in the bilateral carotid bulbs left greater than right. Disc levels:  C2-3 minimal central bulge and disc osteophyte C3-4: Severe disc narrowing with posterior osteophytes, which reached cord. Severe left foraminal narrowing. Left facet arthropathy is greater than right facet arthropathy C4-5: Posterior osteophytic ridging. Central disc herniation reaches the cord. Severe right foraminal narrowing due to uncovertebral  osteophytes. C5-6: Posterior osteophytic ridging, asymmetric to the left reaches the cord. Severe left foraminal narrowing due to uncovertebral osteophytes. C6-7:  Minimal posterior osteophytic ridging.  Foramina patent. C7-T1: On covering of the disc. Grossly patent. Significant bilateral facet arthropathy. Upper thoracic spine discs are unremarkable. Upper chest: No acute abnormality. Other: Noncontributory. IMPRESSION: Head: No acute intracranial pathology.  Atrophy is noted. Face: No acute bony pathology. Mild soft tissue swelling over the lateral right orbit. Cervical spine: No acute bony injury. Advanced degenerative changes as described. Electronically Signed   By: Jolaine ClickArthur  Hoss M.D.   On: 01/02/2017 10:13   Impression:  1.  Syncope.  Anemia/bleeding versus rapid a fib as potential causes.   2.  Acute blood loss anemia. 3.  Chronic anticoagulation. 4.  Melena.  Mild hypotension which is likely multifactorial (rapid a. Fib and bleeding). 5.  Cardiac comorbidities including, at present, rapid atrial fibrillation.  Plan:  1.  Volume replete. 2.  Blood transfusion to keep Hgb >/= 8. 3.  PPI drip. 4.  Serial CBCs. 5.  Hold anticoagulation. 6.  Rate control for tachycardia (which is probably due to both volume depletion and stress-mediated exacerbation of atrial fibrillation). 7.  Will need at least 24 hours off anticoagulation (types of which don't have reversal agent) before we can safely pursue endoscopy; if patient stabilizes with medical therapy, would likely prefer waiting until Tuesday to do endoscopy; if symptoms haven't improved with medical therapy by tomorrow, would likely do endoscopy tomorrow (Monday) instead. 8.  Clear liquid diet today is ok. 9.  Case discussed with hospitalist, patient and family. 10. [facility] will follow.   LOS: 1 day   Irais Mottram,Clemons M  01/03/2017, 12:16 PM  Cell [phone] If no answer or after 5 PM call [phone]

## 2017-01-04 DIAGNOSIS — I10 Essential (primary) hypertension: Secondary | ICD-10-CM

## 2017-01-04 DIAGNOSIS — I214 Non-ST elevation (NSTEMI) myocardial infarction: Secondary | ICD-10-CM

## 2017-01-04 DIAGNOSIS — D62 Acute posthemorrhagic anemia: Secondary | ICD-10-CM

## 2017-01-04 LAB — CBC
HCT: 23.9 % — ABNORMAL LOW (ref 39.0–52.0)
HEMATOCRIT: 23.7 % — AB (ref 39.0–52.0)
HEMATOCRIT: 23.3 % — AB (ref 39.0–52.0)
Hemoglobin: 8.2 g/dL — ABNORMAL LOW (ref 13.0–17.0)
Hemoglobin: 8.3 g/dL — ABNORMAL LOW (ref 13.0–17.0)
Hemoglobin: 8.1 g/dL — ABNORMAL LOW (ref 13.0–17.0)
MCH: 33.6 pg (ref 26.0–34.0)
MCH: 34.7 pg — AB (ref 26.0–34.0)
MCH: 33.9 pg (ref 26.0–34.0)
MCHC: 34.3 g/dL (ref 30.0–36.0)
MCHC: 35 g/dL (ref 30.0–36.0)
MCHC: 34.8 g/dL (ref 30.0–36.0)
MCV: 98 fL (ref 78.0–100.0)
MCV: 99.2 fL (ref 78.0–100.0)
MCV: 97.5 fL (ref 78.0–100.0)
PLATELETS: 120 10*3/uL — AB (ref 150–400)
PLATELETS: 121 10*3/uL — AB (ref 150–400)
Platelets: 129 10*3/uL — ABNORMAL LOW (ref 150–400)
RBC: 2.44 MIL/uL — ABNORMAL LOW (ref 4.22–5.81)
RBC: 2.39 MIL/uL — AB (ref 4.22–5.81)
RBC: 2.39 MIL/uL — ABNORMAL LOW (ref 4.22–5.81)
RDW: 19 % — AB (ref 11.5–15.5)
RDW: 19.3 % — AB (ref 11.5–15.5)
RDW: 18.4 % — AB (ref 11.5–15.5)
WBC: 11.1 10*3/uL — AB (ref 4.0–10.5)
WBC: 10.6 10*3/uL — ABNORMAL HIGH (ref 4.0–10.5)
WBC: 11.6 10*3/uL — ABNORMAL HIGH (ref 4.0–10.5)

## 2017-01-04 LAB — TYPE AND SCREEN
ABO/RH(D): A POS
Antibody Screen: NEGATIVE
Unit division: 0
Unit division: 0

## 2017-01-04 LAB — COMPREHENSIVE METABOLIC PANEL
ALK PHOS: 25 U/L — AB (ref 38–126)
ALT: 17 U/L (ref 17–63)
AST: 31 U/L (ref 15–41)
Albumin: 2.7 g/dL — ABNORMAL LOW (ref 3.5–5.0)
Anion gap: 2 — ABNORMAL LOW (ref 5–15)
BUN: 47 mg/dL — ABNORMAL HIGH (ref 6–20)
CALCIUM: 7.5 mg/dL — AB (ref 8.9–10.3)
CO2: 22 mmol/L (ref 22–32)
CREATININE: 1.05 mg/dL (ref 0.61–1.24)
Chloride: 115 mmol/L — ABNORMAL HIGH (ref 101–111)
Glucose, Bld: 97 mg/dL (ref 65–99)
Potassium: 3.8 mmol/L (ref 3.5–5.1)
SODIUM: 139 mmol/L (ref 135–145)
Total Bilirubin: 1.4 mg/dL — ABNORMAL HIGH (ref 0.3–1.2)
Total Protein: 5.1 g/dL — ABNORMAL LOW (ref 6.5–8.1)

## 2017-01-04 LAB — CBC WITH DIFFERENTIAL/PLATELET
Basophils Absolute: 0 10*3/uL (ref 0.0–0.1)
Basophils Relative: 0 %
Eosinophils Absolute: 0.2 10*3/uL (ref 0.0–0.7)
Eosinophils Relative: 2 %
HEMATOCRIT: 24.4 % — AB (ref 39.0–52.0)
HEMOGLOBIN: 8.3 g/dL — AB (ref 13.0–17.0)
LYMPHS ABS: 2.1 10*3/uL (ref 0.7–4.0)
LYMPHS PCT: 20 %
MCH: 33.1 pg (ref 26.0–34.0)
MCHC: 34 g/dL (ref 30.0–36.0)
MCV: 97.2 fL (ref 78.0–100.0)
Monocytes Absolute: 0.8 10*3/uL (ref 0.1–1.0)
Monocytes Relative: 8 %
NEUTROS ABS: 7.6 10*3/uL (ref 1.7–7.7)
NEUTROS PCT: 70 %
Platelets: 126 10*3/uL — ABNORMAL LOW (ref 150–400)
RBC: 2.51 MIL/uL — ABNORMAL LOW (ref 4.22–5.81)
RDW: 18.3 % — ABNORMAL HIGH (ref 11.5–15.5)
WBC: 10.7 10*3/uL — AB (ref 4.0–10.5)

## 2017-01-04 LAB — TROPONIN I
Troponin I: 2.07 ng/mL (ref <0.03)
Troponin I: 2.42 ng/mL (ref <0.03)

## 2017-01-04 LAB — BPAM RBC
Blood Product Expiration Date: 201811152359
Blood Product Expiration Date: 201811162359
ISSUE DATE / TIME: 201811112005
ISSUE DATE / TIME: 201811111451
Unit Type and Rh: 600
Unit Type and Rh: 6200

## 2017-01-04 LAB — MAGNESIUM: Magnesium: 1.9 mg/dL (ref 1.7–2.4)

## 2017-01-04 MED ORDER — MAGNESIUM SULFATE 2 GM/50ML IV SOLN
2.0000 g | Freq: Once | INTRAVENOUS | Status: AC
  Administered 2017-01-04: 2 g via INTRAVENOUS
  Filled 2017-01-04: qty 50

## 2017-01-04 NOTE — Progress Notes (Addendum)
[facility]  Stephen Crawford 71 y.o. 10-Jul-1945  CC:   Melena, acute blood loss anemia   Subjective: No bowel movement today. Had the dark color stool yesterday but denied any black tarry stool. Denied bright red blood per rectum. Denied abdominal pain.  ROS : Positive  for chest pain and mild  shortness of breath   Objective: Vital signs in last 24 hours: Vitals:   01/04/17 0316 01/04/17 0816  BP: 140/90 131/69  Pulse: (!) 103 98  Resp: 11   Temp: 97.7 F (36.5 C) 98.9 F (37.2 C)  SpO2: 99% 99%    Physical Exam:  General:  Alert, cooperative, somewhat anxious , appears stated age        Lungs:   Clear to auscultation bilaterally, respirations unlabored  Heart:  Irregularly irregular rhythm, tachycardia   Abdomen:   Soft, non-tender, bowel sounds active all four quadrants,  no masses, no peritoneal signs   Extremities: Extremities normal, atraumatic, no  edema  Pulses: 2+ and symmetric    Lab Results: Recent Labs    01/02/17 1132  01/03/17 0258 01/04/17 0059  NA  --    < > 138 139  K  --    < > 4.1 3.8  CL  --    < > 113* 115*  CO2  --    < > 19* 22  GLUCOSE  --    < > 110* 97  BUN  --    < > 70* 47*  CREATININE  --    < > 1.17 1.05  CALCIUM  --    < > 8.2* 7.5*  MG 1.5*  --   --  1.9   < > = values in this interval not displayed.   Recent Labs    01/04/17 0059  AST 31  ALT 17  ALKPHOS 25*  BILITOT 1.4*  PROT 5.1*  ALBUMIN 2.7*   Recent Labs    01/04/17 0059 01/04/17 0621  WBC 10.7* 11.1*  NEUTROABS 7.6  --   HGB 8.3* 8.2*  HCT 24.4* 23.9*  MCV 97.2 98.0  PLT 126* 120*   Recent Labs    01/02/17 0823 01/03/17 1030  LABPROT 13.9 15.8*  INR 1.08 1.27      Assessment/Plan: - Melena with occult blood positive stool. - Anemia. Multifactorial probably with dilutional as well as with GI bleed - Chest pain with elevated troponins - Atrial fibrillation. Was started on Eliquis and amiodarone. Last dose of Eliquis was  on 01/02/2017 - History of coronary artery disease. Was on Plavix. Last dose prior to admission probably on 11/09   Recommendations ----------------------- - Plan for EGD tomorrow if ok with cardiology, pending further cardiac work up  - Monitor H&H. Transfuse as needed. - Continue Protonix for now  Risks (bleeding, infection, perforation that could require surgery, sedation-related changes in cardiopulmonary systems), benefits (identification and possible treatment of source of symptoms, exclusion of certain causes of symptoms), and alternatives (watchful waiting, radiographic imaging studies, empiric medical treatment)  were explained to patient and family in detail and patient wishes to proceed.    Stephen DerParag Keiko Myricks MD, FACP 01/04/2017, 9:23 AM  Contact #  [phone]

## 2017-01-04 NOTE — Progress Notes (Signed)
Troponin 2.07. Cardiology notified. Pt awakened with 6/10 CP and SOB. EKG obtained and uploaded to chart along with VS. SL Nitro given x2. CP resolving. Currently at 2/10. Cardiology paged. Waiting for call back. Will continue to monitor.

## 2017-01-04 NOTE — Progress Notes (Signed)
DAILY PROGRESS NOTE   Patient Name: Stephen Crawford Date of Encounter: 01/04/2017  Chief Complaint   Weak, no chest pain currently  Patient Profile   70 y.o. male with history of CAD admitted with new onset afib and syncope, now with progressive anemia concerning for GI bleed and chest pain with elevated troponin consistent with NSTEMI.  Subjective   Events over the weekend noted, including concern for GI Bleeding. Troponin has elevated to 2.42, suggestive of ACS - hemoglobin has improved with transfusion and appears stable. GI evaluation planned for tomorrow.  Objective   Vitals:   01/03/17 2110 01/03/17 2250 01/04/17 0316 01/04/17 0816  BP:  121/73 140/90 131/69  Pulse: 77 92 (!) 103 98  Resp: 19 17 11    Temp:  98.6 F (37 C) 97.7 F (36.5 C) 98.9 F (37.2 C)  TempSrc:  Oral Oral Oral  SpO2: 99% 100% 99% 99%  Weight:      Height:        Intake/Output Summary (Last 24 hours) at 01/04/2017 0932 Last data filed at 01/04/2017 0900 Gross per 24 hour  Intake 3462.5 ml  Output 1900 ml  Net 1562.5 ml   Filed Weights   01/02/17 0820 01/03/17 0315  Weight: 177 lb (80.3 kg) 162 lb 4.8 oz (73.6 kg)    Physical Exam   General appearance: alert and no distress Neck: no carotid bruit, no JVD and thyroid not enlarged, symmetric, no tenderness/mass/nodules Lungs: clear to auscultation bilaterally Heart: regular rate and rhythm, S1, S2 normal, no murmur, click, rub or gallop Abdomen: soft, non-tender; bowel sounds normal; no masses,  no organomegaly Extremities: extremities normal, atraumatic, no cyanosis or edema Pulses: 2+ and symmetric Skin: Skin color, texture, turgor normal. No rashes or lesions Neurologic: Grossly normal Psych: Pleasant  Inpatient Medications    Scheduled Meds: . amiodarone  400 mg Oral BID  . atorvastatin  20 mg Oral Daily  . escitalopram  10 mg Oral Daily  . pantoprazole (PROTONIX) IV  40 mg Intravenous Q12H  . senna  1 tablet Oral BID  .  sodium chloride flush  3 mL Intravenous Q12H    Continuous Infusions: . sodium chloride    . sodium chloride 500 mL/hr at 01/03/17 1247  . sodium chloride    . magnesium sulfate 1 - 4 g bolus IVPB 2 g (01/04/17 0844)  . pantoprozole (PROTONIX) infusion 8 mg/hr (01/04/17 0138)    PRN Meds: sodium chloride, acetaminophen **OR** acetaminophen, albuterol, alum & mag hydroxide-simeth, nitroGLYCERIN, ondansetron **OR** ondansetron (ZOFRAN) IV, polyethylene glycol, sodium chloride flush   Labs   Results for orders placed or performed during the hospital encounter of 01/02/17 (from the past 48 hour(s))  Urinalysis, Routine w reflex microscopic     Status: Abnormal   Collection Time: 01/02/17 10:34 AM  Result Value Ref Range   Color, Urine YELLOW YELLOW   APPearance CLEAR CLEAR   Specific Gravity, Urine 1.019 1.005 - 1.030   pH 5.0 5.0 - 8.0   Glucose, UA NEGATIVE NEGATIVE mg/dL   Hgb urine dipstick SMALL (A) NEGATIVE   Bilirubin Urine NEGATIVE NEGATIVE   Ketones, ur NEGATIVE NEGATIVE mg/dL   Protein, ur NEGATIVE NEGATIVE mg/dL   Nitrite NEGATIVE NEGATIVE   Leukocytes, UA NEGATIVE NEGATIVE   RBC / HPF 0-5 0 - 5 RBC/hpf   WBC, UA 0-5 0 - 5 WBC/hpf   Bacteria, UA NONE SEEN NONE SEEN   Squamous Epithelial / LPF NONE SEEN NONE SEEN   Mucus  PRESENT    Hyaline Casts, UA PRESENT   Magnesium     Status: Abnormal   Collection Time: 01/02/17 11:32 AM  Result Value Ref Range   Magnesium 1.5 (L) 1.7 - 2.4 mg/dL  TSH     Status: None   Collection Time: 01/02/17 11:32 AM  Result Value Ref Range   TSH 0.412 0.350 - 4.500 uIU/mL    Comment: Performed by a 3rd Generation assay with a functional sensitivity of <=0.01 uIU/mL.  Culture, blood (Routine X 2) w Reflex to ID Panel     Status: None (Preliminary result)   Collection Time: 01/02/17 12:35 PM  Result Value Ref Range   Specimen Description BLOOD RIGHT ARM    Special Requests      BOTTLES DRAWN AEROBIC AND ANAEROBIC Blood Culture results  may not be optimal due to an excessive volume of blood received in culture bottles   Culture NO GROWTH < 24 HOURS    Report Status PENDING   Culture, blood (Routine X 2) w Reflex to ID Panel     Status: None (Preliminary result)   Collection Time: 01/02/17 12:50 PM  Result Value Ref Range   Specimen Description BLOOD LEFT ARM    Special Requests      BOTTLES DRAWN AEROBIC AND ANAEROBIC Blood Culture results may not be optimal due to an excessive volume of blood received in culture bottles   Culture NO GROWTH < 24 HOURS    Report Status PENDING   Troponin I     Status: Abnormal   Collection Time: 01/02/17  3:58 PM  Result Value Ref Range   Troponin I 0.49 (HH) <0.03 ng/mL    Comment: CRITICAL RESULT CALLED TO, READ BACK BY AND VERIFIED WITH: L.[email or web address],RN [id number] 11.10.18 SPIKESN   Basic metabolic panel     Status: Abnormal   Collection Time: 01/02/17  3:58 PM  Result Value Ref Range   Sodium 138 135 - 145 mmol/L   Potassium 4.1 3.5 - 5.1 mmol/L   Chloride 113 (H) 101 - 111 mmol/L   CO2 21 (L) 22 - 32 mmol/L   Glucose, Bld 106 (H) 65 - 99 mg/dL   BUN 75 (H) 6 - 20 mg/dL   Creatinine, Ser 1.11 0.61 - 1.24 mg/dL   Calcium 8.3 (L) 8.9 - 10.3 mg/dL   GFR calc non Af Amer >60 >60 mL/min   GFR calc Af Amer >60 >60 mL/min    Comment: (NOTE) The eGFR has been calculated using the CKD EPI equation. This calculation has not been validated in all clinical situations. eGFR's persistently <60 mL/min signify possible Chronic Kidney Disease.    Anion gap 4 (L) 5 - 15  Troponin I     Status: Abnormal   Collection Time: 01/02/17 10:40 PM  Result Value Ref Range   Troponin I 0.52 (HH) <0.03 ng/mL    Comment: CRITICAL VALUE NOTED.  VALUE IS CONSISTENT WITH PREVIOUSLY REPORTED AND CALLED VALUE.  Troponin I     Status: Abnormal   Collection Time: 01/03/17  2:58 AM  Result Value Ref Range   Troponin I 0.38 (HH) <0.03 ng/mL    Comment: CRITICAL VALUE NOTED.  VALUE IS CONSISTENT WITH PREVIOUSLY  REPORTED AND CALLED VALUE.  Basic metabolic panel     Status: Abnormal   Collection Time: 01/03/17  2:58 AM  Result Value Ref Range   Sodium 138 135 - 145 mmol/L   Potassium 4.1 3.5 - 5.1 mmol/L   Chloride 113 (H)  101 - 111 mmol/L   CO2 19 (L) 22 - 32 mmol/L   Glucose, Bld 110 (H) 65 - 99 mg/dL   BUN 70 (H) 6 - 20 mg/dL   Creatinine, Ser 1.17 0.61 - 1.24 mg/dL   Calcium 8.2 (L) 8.9 - 10.3 mg/dL   GFR calc non Af Amer >60 >60 mL/min   GFR calc Af Amer >60 >60 mL/min    Comment: (NOTE) The eGFR has been calculated using the CKD EPI equation. This calculation has not been validated in all clinical situations. eGFR's persistently <60 mL/min signify possible Chronic Kidney Disease.    Anion gap 6 5 - 15  CBC     Status: Abnormal   Collection Time: 01/03/17  2:58 AM  Result Value Ref Range   WBC 11.1 (H) 4.0 - 10.5 K/uL   RBC 2.58 (L) 4.22 - 5.81 MIL/uL   Hemoglobin 9.3 (L) 13.0 - 17.0 g/dL    Comment: DELTA CHECK NOTED REPEATED TO VERIFY    HCT 26.6 (L) 39.0 - 52.0 %   MCV 103.1 (H) 78.0 - 100.0 fL   MCH 36.0 (H) 26.0 - 34.0 pg   MCHC 35.0 30.0 - 36.0 g/dL   RDW 14.6 11.5 - 15.5 %   Platelets 168 150 - 400 K/uL  Hemoglobin and hematocrit, blood     Status: Abnormal   Collection Time: 01/03/17 10:30 AM  Result Value Ref Range   Hemoglobin 7.9 (L) 13.0 - 17.0 g/dL   HCT 23.3 (L) 39.0 - 52.0 %  APTT     Status: None   Collection Time: 01/03/17 10:30 AM  Result Value Ref Range   aPTT 24 24 - 36 seconds  Protime-INR     Status: Abnormal   Collection Time: 01/03/17 10:30 AM  Result Value Ref Range   Prothrombin Time 15.8 (H) 11.4 - 15.2 seconds   INR 1.27   Prepare RBC     Status: None   Collection Time: 01/03/17 11:23 AM  Result Value Ref Range   Order Confirmation ORDER PROCESSED BY BLOOD BANK   Occult blood card to lab, stool     Status: Abnormal   Collection Time: 01/03/17 11:29 AM  Result Value Ref Range   Fecal Occult Bld POSITIVE (A) NEGATIVE  Troponin I (q 6hr x  3)     Status: Abnormal   Collection Time: 01/03/17 11:47 AM  Result Value Ref Range   Troponin I 0.13 (HH) <0.03 ng/mL    Comment: CRITICAL VALUE NOTED.  VALUE IS CONSISTENT WITH PREVIOUSLY REPORTED AND CALLED VALUE.  Type and screen [place]     Status: None   Collection Time: 01/03/17 11:56 AM  Result Value Ref Range   ABO/RH(D) A POS    Antibody Screen NEG    Sample Expiration 01/06/2017    Unit Number [id number]    Blood Component Type RED CELLS,LR    Unit division 00    Status of Unit ISSUED,FINAL    Transfusion Status OK TO TRANSFUSE    Crossmatch Result Compatible    Unit Number [id number]    Blood Component Type RED CELLS,LR    Unit division 00    Status of Unit ISSUED,FINAL    Transfusion Status OK TO TRANSFUSE    Crossmatch Result Compatible   ABO/Rh     Status: None   Collection Time: 01/03/17 11:56 AM  Result Value Ref Range   ABO/RH(D) A POS   MRSA PCR Screening  Status: None   Collection Time: 01/03/17  1:13 PM  Result Value Ref Range   MRSA by PCR NEGATIVE NEGATIVE    Comment:        The GeneXpert MRSA Assay (FDA approved for NASAL specimens only), is one component of a comprehensive MRSA colonization surveillance program. It is not intended to diagnose MRSA infection nor to guide or monitor treatment for MRSA infections.   CBC     Status: Abnormal   Collection Time: 01/03/17  2:27 PM  Result Value Ref Range   WBC 13.1 (H) 4.0 - 10.5 K/uL   RBC 2.18 (L) 4.22 - 5.81 MIL/uL   Hemoglobin 7.6 (L) 13.0 - 17.0 g/dL   HCT 22.2 (L) 39.0 - 52.0 %   MCV 101.8 (H) 78.0 - 100.0 fL   MCH 34.9 (H) 26.0 - 34.0 pg   MCHC 34.2 30.0 - 36.0 g/dL   RDW 14.5 11.5 - 15.5 %   Platelets 148 (L) 150 - 400 K/uL  Troponin I     Status: Abnormal   Collection Time: 01/04/17 12:58 AM  Result Value Ref Range   Troponin I 2.07 (HH) <0.03 ng/mL    Comment: REPEATED TO VERIFY CRITICAL RESULT CALLED TO, READ BACK BY AND VERIFIED WITH: B.KUZMICK,RN  0219 01/04/17 M.CAMPBELL   CBC with Differential/Platelet     Status: Abnormal   Collection Time: 01/04/17 12:59 AM  Result Value Ref Range   WBC 10.7 (H) 4.0 - 10.5 K/uL   RBC 2.51 (L) 4.22 - 5.81 MIL/uL   Hemoglobin 8.3 (L) 13.0 - 17.0 g/dL   HCT 24.4 (L) 39.0 - 52.0 %   MCV 97.2 78.0 - 100.0 fL   MCH 33.1 26.0 - 34.0 pg   MCHC 34.0 30.0 - 36.0 g/dL   RDW 18.3 (H) 11.5 - 15.5 %   Platelets 126 (L) 150 - 400 K/uL   Neutrophils Relative % 70 %   Neutro Abs 7.6 1.7 - 7.7 K/uL   Lymphocytes Relative 20 %   Lymphs Abs 2.1 0.7 - 4.0 K/uL   Monocytes Relative 8 %   Monocytes Absolute 0.8 0.1 - 1.0 K/uL   Eosinophils Relative 2 %   Eosinophils Absolute 0.2 0.0 - 0.7 K/uL   Basophils Relative 0 %   Basophils Absolute 0.0 0.0 - 0.1 K/uL  Comprehensive metabolic panel     Status: Abnormal   Collection Time: 01/04/17 12:59 AM  Result Value Ref Range   Sodium 139 135 - 145 mmol/L   Potassium 3.8 3.5 - 5.1 mmol/L   Chloride 115 (H) 101 - 111 mmol/L   CO2 22 22 - 32 mmol/L   Glucose, Bld 97 65 - 99 mg/dL   BUN 47 (H) 6 - 20 mg/dL   Creatinine, Ser 1.05 0.61 - 1.24 mg/dL   Calcium 7.5 (L) 8.9 - 10.3 mg/dL   Total Protein 5.1 (L) 6.5 - 8.1 g/dL   Albumin 2.7 (L) 3.5 - 5.0 g/dL   AST 31 15 - 41 U/L   ALT 17 17 - 63 U/L   Alkaline Phosphatase 25 (L) 38 - 126 U/L   Total Bilirubin 1.4 (H) 0.3 - 1.2 mg/dL   GFR calc non Af Amer >60 >60 mL/min   GFR calc Af Amer >60 >60 mL/min    Comment: (NOTE) The eGFR has been calculated using the CKD EPI equation. This calculation has not been validated in all clinical situations. eGFR's persistently <60 mL/min signify possible Chronic Kidney Disease.  Anion gap 2 (L) 5 - 15    Comment: REPEATED TO VERIFY  Magnesium     Status: None   Collection Time: 01/04/17 12:59 AM  Result Value Ref Range   Magnesium 1.9 1.7 - 2.4 mg/dL  CBC     Status: Abnormal   Collection Time: 01/04/17  6:21 AM  Result Value Ref Range   WBC 11.1 (H) 4.0 - 10.5 K/uL    RBC 2.44 (L) 4.22 - 5.81 MIL/uL   Hemoglobin 8.2 (L) 13.0 - 17.0 g/dL   HCT 23.9 (L) 39.0 - 52.0 %   MCV 98.0 78.0 - 100.0 fL   MCH 33.6 26.0 - 34.0 pg   MCHC 34.3 30.0 - 36.0 g/dL   RDW 19.0 (H) 11.5 - 15.5 %   Platelets 120 (L) 150 - 400 K/uL  Troponin I     Status: Abnormal   Collection Time: 01/04/17  6:21 AM  Result Value Ref Range   Troponin I 2.42 (HH) <0.03 ng/mL    Comment: CRITICAL VALUE NOTED.  VALUE IS CONSISTENT WITH PREVIOUSLY REPORTED AND CALLED VALUE.    ECG   Sinus tachy at 101, possible inferolateral infarct, PVC's - Personally Reviewed  Telemetry   Sinus tachycardia at 101 - Personally Reviewed  Radiology    Ct Head Wo Contrast  Result Date: 01/02/2017 CLINICAL DATA:  Dizziness. Fall. Struck right side of face on that test. It has tried ight bruising. EXAM: CT HEAD WITHOUT CONTRAST CT MAXILLOFACIAL WITHOUT CONTRAST CT CERVICAL SPINE WITHOUT CONTRAST TECHNIQUE: Multidetector CT imaging of the head, cervical spine, and maxillofacial structures were performed using the standard protocol without intravenous contrast. Multiplanar CT image reconstructions of the cervical spine and maxillofacial structures were also generated. COMPARISON:  None. FINDINGS: CT HEAD FINDINGS Brain: Global atrophy. No mass effect, midline shift, or acute hemorrhage. No focal acute infarct. Vascular: No hyperdense vessel or unexpected calcification. Skull: The cranium is intact. Other: Noncontributory CT MAXILLOFACIAL FINDINGS Osseous: No acute fracture. No dislocation. No destructive bone lesion. Nasal septum deviated to the right. Orbits: No evidence of vitreous hemorrhage or orbital hemorrhage. Sinuses: Is visualized paranasal sinuses are clear. Mastoid air cells clear. Soft tissues: Airways patent. No obvious soft tissue hematoma. No obvious mass. Mild soft tissue swelling over the lateral orbital rim. It is this correlates with the patient's bruising. CT CERVICAL SPINE FINDINGS Alignment:  There is slight anterolisthesis C7 upon T1 related to facet arthropathy. Otherwise anatomic alignment is preserved. Skull base and vertebrae: Mild wedging of C7 and T1 has a chronic appearance. No obvious acute fracture lines are visualized. Soft tissues and spinal canal: No obvious spinal hematoma. No obvious soft tissue hemorrhage. Thyroid is heterogeneous. 1.3 cm left thyroid hypodensity is noted. No obvious abnormal adenopathy. No evidence of aneurysm. Atherosclerotic calcifications in the bilateral carotid bulbs left greater than right. Disc levels:  C2-3 minimal central bulge and disc osteophyte C3-4: Severe disc narrowing with posterior osteophytes, which reached cord. Severe left foraminal narrowing. Left facet arthropathy is greater than right facet arthropathy C4-5: Posterior osteophytic ridging. Central disc herniation reaches the cord. Severe right foraminal narrowing due to uncovertebral osteophytes. C5-6: Posterior osteophytic ridging, asymmetric to the left reaches the cord. Severe left foraminal narrowing due to uncovertebral osteophytes. C6-7:  Minimal posterior osteophytic ridging.  Foramina patent. C7-T1: On covering of the disc. Grossly patent. Significant bilateral facet arthropathy. Upper thoracic spine discs are unremarkable. Upper chest: No acute abnormality. Other: Noncontributory. IMPRESSION: Head: No acute intracranial pathology.  Atrophy is  noted. Face: No acute bony pathology. Mild soft tissue swelling over the lateral right orbit. Cervical spine: No acute bony injury. Advanced degenerative changes as described. Electronically Signed   By: Marybelle Killings M.D.   On: 01/02/2017 10:13   Ct Cervical Spine Wo Contrast  Result Date: 01/02/2017 CLINICAL DATA:  Dizziness. Fall. Struck right side of face on that test. It has tried ight bruising. EXAM: CT HEAD WITHOUT CONTRAST CT MAXILLOFACIAL WITHOUT CONTRAST CT CERVICAL SPINE WITHOUT CONTRAST TECHNIQUE: Multidetector CT imaging of the head,  cervical spine, and maxillofacial structures were performed using the standard protocol without intravenous contrast. Multiplanar CT image reconstructions of the cervical spine and maxillofacial structures were also generated. COMPARISON:  None. FINDINGS: CT HEAD FINDINGS Brain: Global atrophy. No mass effect, midline shift, or acute hemorrhage. No focal acute infarct. Vascular: No hyperdense vessel or unexpected calcification. Skull: The cranium is intact. Other: Noncontributory CT MAXILLOFACIAL FINDINGS Osseous: No acute fracture. No dislocation. No destructive bone lesion. Nasal septum deviated to the right. Orbits: No evidence of vitreous hemorrhage or orbital hemorrhage. Sinuses: Is visualized paranasal sinuses are clear. Mastoid air cells clear. Soft tissues: Airways patent. No obvious soft tissue hematoma. No obvious mass. Mild soft tissue swelling over the lateral orbital rim. It is this correlates with the patient's bruising. CT CERVICAL SPINE FINDINGS Alignment: There is slight anterolisthesis C7 upon T1 related to facet arthropathy. Otherwise anatomic alignment is preserved. Skull base and vertebrae: Mild wedging of C7 and T1 has a chronic appearance. No obvious acute fracture lines are visualized. Soft tissues and spinal canal: No obvious spinal hematoma. No obvious soft tissue hemorrhage. Thyroid is heterogeneous. 1.3 cm left thyroid hypodensity is noted. No obvious abnormal adenopathy. No evidence of aneurysm. Atherosclerotic calcifications in the bilateral carotid bulbs left greater than right. Disc levels:  C2-3 minimal central bulge and disc osteophyte C3-4: Severe disc narrowing with posterior osteophytes, which reached cord. Severe left foraminal narrowing. Left facet arthropathy is greater than right facet arthropathy C4-5: Posterior osteophytic ridging. Central disc herniation reaches the cord. Severe right foraminal narrowing due to uncovertebral osteophytes. C5-6: Posterior osteophytic  ridging, asymmetric to the left reaches the cord. Severe left foraminal narrowing due to uncovertebral osteophytes. C6-7:  Minimal posterior osteophytic ridging.  Foramina patent. C7-T1: On covering of the disc. Grossly patent. Significant bilateral facet arthropathy. Upper thoracic spine discs are unremarkable. Upper chest: No acute abnormality. Other: Noncontributory. IMPRESSION: Head: No acute intracranial pathology.  Atrophy is noted. Face: No acute bony pathology. Mild soft tissue swelling over the lateral right orbit. Cervical spine: No acute bony injury. Advanced degenerative changes as described. Electronically Signed   By: Marybelle Killings M.D.   On: 01/02/2017 10:13   Ct Maxillofacial Wo Contrast  Result Date: 01/02/2017 CLINICAL DATA:  Dizziness. Fall. Struck right side of face on that test. It has tried ight bruising. EXAM: CT HEAD WITHOUT CONTRAST CT MAXILLOFACIAL WITHOUT CONTRAST CT CERVICAL SPINE WITHOUT CONTRAST TECHNIQUE: Multidetector CT imaging of the head, cervical spine, and maxillofacial structures were performed using the standard protocol without intravenous contrast. Multiplanar CT image reconstructions of the cervical spine and maxillofacial structures were also generated. COMPARISON:  None. FINDINGS: CT HEAD FINDINGS Brain: Global atrophy. No mass effect, midline shift, or acute hemorrhage. No focal acute infarct. Vascular: No hyperdense vessel or unexpected calcification. Skull: The cranium is intact. Other: Noncontributory CT MAXILLOFACIAL FINDINGS Osseous: No acute fracture. No dislocation. No destructive bone lesion. Nasal septum deviated to the right. Orbits: No evidence of vitreous hemorrhage  or orbital hemorrhage. Sinuses: Is visualized paranasal sinuses are clear. Mastoid air cells clear. Soft tissues: Airways patent. No obvious soft tissue hematoma. No obvious mass. Mild soft tissue swelling over the lateral orbital rim. It is this correlates with the patient's bruising. CT  CERVICAL SPINE FINDINGS Alignment: There is slight anterolisthesis C7 upon T1 related to facet arthropathy. Otherwise anatomic alignment is preserved. Skull base and vertebrae: Mild wedging of C7 and T1 has a chronic appearance. No obvious acute fracture lines are visualized. Soft tissues and spinal canal: No obvious spinal hematoma. No obvious soft tissue hemorrhage. Thyroid is heterogeneous. 1.3 cm left thyroid hypodensity is noted. No obvious abnormal adenopathy. No evidence of aneurysm. Atherosclerotic calcifications in the bilateral carotid bulbs left greater than right. Disc levels:  C2-3 minimal central bulge and disc osteophyte C3-4: Severe disc narrowing with posterior osteophytes, which reached cord. Severe left foraminal narrowing. Left facet arthropathy is greater than right facet arthropathy C4-5: Posterior osteophytic ridging. Central disc herniation reaches the cord. Severe right foraminal narrowing due to uncovertebral osteophytes. C5-6: Posterior osteophytic ridging, asymmetric to the left reaches the cord. Severe left foraminal narrowing due to uncovertebral osteophytes. C6-7:  Minimal posterior osteophytic ridging.  Foramina patent. C7-T1: On covering of the disc. Grossly patent. Significant bilateral facet arthropathy. Upper thoracic spine discs are unremarkable. Upper chest: No acute abnormality. Other: Noncontributory. IMPRESSION: Head: No acute intracranial pathology.  Atrophy is noted. Face: No acute bony pathology. Mild soft tissue swelling over the lateral right orbit. Cervical spine: No acute bony injury. Advanced degenerative changes as described. Electronically Signed   By: Marybelle Killings M.D.   On: 01/02/2017 10:13    Cardiac Studies   LV EF: 65% -   70%  ------------------------------------------------------------------- Indications:      Atrial fibrillation - 427.31.  ------------------------------------------------------------------- History:   PMH:   Syncope.  Atrial  fibrillation.  Coronary artery disease.  Risk factors:  Hypertension.  ------------------------------------------------------------------- Study Conclusions  - Left ventricle: The cavity size was normal. Wall thickness was   increased in a pattern of mild LVH. Systolic function was   vigorous. The estimated ejection fraction was in the range of 65%   to 70%. Wall motion was normal; there were no regional wall   motion abnormalities. Doppler parameters are consistent with   abnormal left ventricular relaxation (grade 1 diastolic   dysfunction). - Aortic valve: Mildly calcified annulus. A bicuspid morphology   cannot be excluded; mildly calcified leaflets. - Right atrium: Central venous pressure (est): 3 mm Hg. - Tricuspid valve: There was trivial regurgitation. - Pulmonary arteries: PA peak pressure: 29 mm Hg (S). - Pericardium, extracardiac: There was no pericardial effusion.  Impressions:  - Mild LVH with LVEF 65-70% and grade 1 diastolic dysfunction.   Mildly calcified aortic annulus, cannot exclude bicuspid   morphology with mildly calcified leaflets. Trivial tricuspid   regurgitation with estimated PASP 29 mmHg.  Assessment   1. Principal Problem: 2.   New onset a-fib with RVR 3. Active Problems: 4.   Syncope and collapse 5.   CAD/stents 6.   HTN (hypertension) 7.   Plan   1. 4-5 loose stools which were dark yesterday. Feels better today. HGb stable. Awaiting definitive GI work-up. Appears he had a true NSTEMI - will likely need a diagnostic cath at some point, but would like to be more comfortable as to whether he could be a candidate for DAPT, which will depend on GI eval +/- treatment. Echo fortunately shows normal LVEF and  he is predominantly in sinus now on oral amiodarone - will decreased to 400 mg daily after 1 week.  Time Spent Directly with Patient:  I have spent a total of 25 minutes with the patient reviewing hospital notes, telemetry, EKGs, labs and  examining the patient as well as establishing an assessment and plan that was discussed personally with the patient. > 50% of time was spent in direct patient care.  Length of Stay:  LOS: 2 days   Pixie Casino, MD, [place] [facility]  Attending Cardiologist  Direct Dial: [phone]  Fax: [phone]  Website:  [email or web address] Nyron Mozer 01/04/2017, 9:32 AM

## 2017-01-04 NOTE — Plan of Care (Signed)
  Progressing Education: Knowledge of General Education information will improve 01/04/2017 0408 - Progressing by Mellody DrownKuzmick, Bernis Stecher, RN Pain Managment: General experience of comfort will improve 01/04/2017 0408 - Progressing by Mellody DrownKuzmick, Biance Moncrief, RN Safety: Ability to remain free from injury will improve 01/04/2017 0408 - Progressing by Mellody DrownKuzmick, Titan Karner, RN Skin Integrity: Risk for impaired skin integrity will decrease 01/04/2017 0408 - Progressing by Mellody DrownKuzmick, Dala Breault, RN Cardiac: Ability to achieve and maintain adequate cardiopulmonary perfusion will improve 01/04/2017 0408 - Progressing by Mellody DrownKuzmick, Abbygale Lapid, RN

## 2017-01-04 NOTE — Progress Notes (Signed)
PROGRESS NOTE    Stephen Crawford  ZOX:[id number] DOB: [birth year] DOA: 01/02/2017 PCP: System, Pcp Not In   Brief Narrative: 71 year old male with history of CAD, MI, stenting in 2015 admitted with syncopal episodes x2 at home.  But this was associated with nausea shortness of breath and lightheadedness.  He reported both the incidents after passing urine.  He hit his back of his head and bruising bruised his back of his head and has ecchymosis on the right side of his face.  When he came to the emergency room his heart rate was in on Cardizem drip and he was converted to sinus rhythm.  He has no history of previous syncopal episodes.  He was otherwise in his usual state of health.  He denies any chest pain though his daughter states that he has some chest discomfort.  He has a history of ischemic heart disease and LAD PCI in 2015.  Echocardiogram in 2015 showed normal LV function. Today when I saw him patient continues to have nausea with no vomiting, patient also reports that he had diarrhea which was black in color.  He stood up to urinate early this morning and had another syncopal event in the hospital.  His orthostatics were done this morning and his systolic blood pressure dropped significantly from 123-80.  His systolic blood pressure was 123 laying down and 80 standing up.  And he was his heart rate jumped up to 20 beats when he was standing.  He was started on IV hydration right away with 250 cc bolus.  Stools are dark and gauiac positive.he received 2 units blood transfusion on 11th. Patient did not have any more syncope since yesterday morning after fluid boluses and blood transfusion given.he denies dizziness.he was orthosstatic prior to iv hydration. He had chest pain last nite releived with 2 nitros.his trop peaked at 2.42.he is now sinus tachy.on and off afib.on amio.   Assessment & Plan:   Principal Problem:   New onset a-fib with RVR Active Problems:   Syncope and collapse  CAD/stents   HTN (hypertension)  New onset A. fib with RVR status post Cardizem now in normal sinus rhythm heart rate anywhere from 90 to 100.  He was also started on amiodarone by cardiology.  He was placed on Eliquis at the time of admission.  Will hold Eliquis at this time due to GI bleed.  Patient was on aspirin and Plavix at home.  Patient have an echocardiogram done later today normal EF.  Normal TSH.  Possible cath at some point.  Anemia with drop in hemoglobin from 13.3-9.3.  Patient has black stools guaiac stools are pending at this time.  GI consult called will see the patient today.  Started on IV Protonix twice a day.  Serial H&H being done with follow-up labs tomorrow.He is s/p 2 units of prbc.hb 7.6 to 8.3 after transfusion.ongoing bleeding is a concern??dilutional effect.  For EGD November 13.  Syncope-multifactorial?  A. fib RVR versus micturition syncope versus orthostatic syncope.  Continue amiodarone for A. Fib.  Leukocytosis at the time of admission his white count was 19,000 down to 11,000 without any treatment with antibiotics.  Question stress related or reactive.  Follow-up tomorrow.  UA and chest x-ray has been negative.  There is no signs of active infection at this time.  History of hypertension now with orthostatic hypotension amlodipine on hold at this time.  Continue amiodarone.  AK I secondary to dehydration follow-up levels after hydration.  Meloxicam has  been stopped at this time.  Hypomagnesemia repleted    DVT prophylaxis: scd Code Status:full Family Communication: dw daughter Disposition Plan:tbd  Consultants: gi,cards  Procedures:none Antimicrobials: none Subjective: Feels better now denies any chest pain or shortness of breath at this time Objective: Resting in bed in no acute distress. Vitals:   01/03/17 2110 01/03/17 2250 01/04/17 0316 01/04/17 0816  BP:  121/73 140/90 131/69  Pulse: 77 92 (!) 103 98  Resp: 19 17 11    Temp:  98.6 F  (37 C) 97.7 F (36.5 C) 98.9 F (37.2 C)  TempSrc:  Oral Oral Oral  SpO2: 99% 100% 99% 99%  Weight:      Height:        Intake/Output Summary (Last 24 hours) at 01/04/2017 1009 Last data filed at 01/04/2017 0900 Gross per 24 hour  Intake 3462.5 ml  Output 1900 ml  Net 1562.5 ml   Filed Weights   01/02/17 0820 01/03/17 0315  Weight: 80.3 kg (177 lb) 73.6 kg (162 lb 4.8 oz)    Examination:  General exam: Appears calm and comfortable  Respiratory system: Clear to auscultation. Respiratory effort normal. Cardiovascular system: S1 & S2 heard, RRR. No JVD, murmurs, rubs, gallops or clicks. No pedal edema. Gastrointestinal system: Abdomen is nondistended, soft and nontender. No organomegaly or masses felt. Normal bowel sounds heard. Central nervous system: Alert and oriented. No focal neurological deficits. Extremities: Symmetric 5 x 5 power. Skin: No rashes, lesions or ulcers Psychiatry: Judgement and insight appear normal. Mood & affect appropriate.     Data Reviewed: I have personally reviewed following labs and imaging studies  CBC: Recent Labs  Lab 01/02/17 0823 01/03/17 0258 01/03/17 1030 01/03/17 1427 01/04/17 0059 01/04/17 0621  WBC 19.2* 11.1*  --  13.1* 10.7* 11.1*  NEUTROABS  --   --   --   --  7.6  --   HGB 13.3 9.3* 7.9* 7.6* 8.3* 8.2*  HCT 39.0 26.6* 23.3* 22.2* 24.4* 23.9*  MCV 102.9* 103.1*  --  101.8* 97.2 98.0  PLT 211 168  --  148* 126* 120*   Basic Metabolic Panel: Recent Labs  Lab 01/02/17 0823 01/02/17 1132 01/02/17 1558 01/03/17 0258 01/04/17 0059  NA 133*  --  138 138 139  K 4.5  --  4.1 4.1 3.8  CL 106  --  113* 113* 115*  CO2 16*  --  21* 19* 22  GLUCOSE 122*  --  106* 110* 97  BUN 82*  --  75* 70* 47*  CREATININE 1.24  --  1.11 1.17 1.05  CALCIUM 9.0  --  8.3* 8.2* 7.5*  MG  --  1.5*  --   --  1.9   GFR: Estimated Creatinine Clearance: 62.7 mL/min (by C-G formula based on SCr of 1.05 mg/dL). Liver Function Tests: Recent  Labs  Lab 01/04/17 0059  AST 31  ALT 17  ALKPHOS 25*  BILITOT 1.4*  PROT 5.1*  ALBUMIN 2.7*   No results for input(s): LIPASE, AMYLASE in the last 168 hours. No results for input(s): AMMONIA in the last 168 hours. Coagulation Profile: Recent Labs  Lab 01/02/17 0823 01/03/17 1030  INR 1.08 1.27   Cardiac Enzymes: Recent Labs  Lab 01/02/17 2240 01/03/17 0258 01/03/17 1147 01/04/17 0058 01/04/17 0621  TROPONINI 0.52* 0.38* 0.13* 2.07* 2.42*   BNP (last 3 results) No results for input(s): PROBNP in the last 8760 hours. HbA1C: No results for input(s): HGBA1C in the last 72 hours. CBG: No  results for input(s): GLUCAP in the last 168 hours. Lipid Profile: No results for input(s): CHOL, HDL, LDLCALC, TRIG, CHOLHDL, LDLDIRECT in the last 72 hours. Thyroid Function Tests: Recent Labs    01/02/17 1132  TSH 0.412   Anemia Panel: No results for input(s): VITAMINB12, FOLATE, FERRITIN, TIBC, IRON, RETICCTPCT in the last 72 hours. Sepsis Labs: No results for input(s): PROCALCITON, LATICACIDVEN in the last 168 hours.  Recent Results (from the past 240 hour(s))  Culture, blood (Routine X 2) w Reflex to ID Panel     Status: None (Preliminary result)   Collection Time: 01/02/17 12:35 PM  Result Value Ref Range Status   Specimen Description BLOOD RIGHT ARM  Final   Special Requests   Final    BOTTLES DRAWN AEROBIC AND ANAEROBIC Blood Culture results may not be optimal due to an excessive volume of blood received in culture bottles   Culture NO GROWTH < 24 HOURS  Final   Report Status PENDING  Incomplete  Culture, blood (Routine X 2) w Reflex to ID Panel     Status: None (Preliminary result)   Collection Time: 01/02/17 12:50 PM  Result Value Ref Range Status   Specimen Description BLOOD LEFT ARM  Final   Special Requests   Final    BOTTLES DRAWN AEROBIC AND ANAEROBIC Blood Culture results may not be optimal due to an excessive volume of blood received in culture bottles    Culture NO GROWTH < 24 HOURS  Final   Report Status PENDING  Incomplete  MRSA PCR Screening     Status: None   Collection Time: 01/03/17  1:13 PM  Result Value Ref Range Status   MRSA by PCR NEGATIVE NEGATIVE Final    Comment:        The GeneXpert MRSA Assay (FDA approved for NASAL specimens only), is one component of a comprehensive MRSA colonization surveillance program. It is not intended to diagnose MRSA infection nor to guide or monitor treatment for MRSA infections.          Radiology Studies: No results found.      Scheduled Meds: . amiodarone  400 mg Oral BID  . atorvastatin  20 mg Oral Daily  . escitalopram  10 mg Oral Daily  . pantoprazole (PROTONIX) IV  40 mg Intravenous Q12H  . senna  1 tablet Oral BID  . sodium chloride flush  3 mL Intravenous Q12H   Continuous Infusions: . sodium chloride    . sodium chloride 500 mL/hr at 01/03/17 1247  . sodium chloride    . pantoprozole (PROTONIX) infusion 8 mg/hr (01/04/17 0138)     LOS: 2 days      Alwyn Ren, MD [facility] If 7PM-7AM, please contact night-coverage [email or web address] Password [email or web address] 01/04/2017, 10:09 AM

## 2017-01-05 ENCOUNTER — Inpatient Hospital Stay (HOSPITAL_COMMUNITY): Payer: Medicare HMO | Admitting: Anesthesiology

## 2017-01-05 ENCOUNTER — Encounter (HOSPITAL_COMMUNITY)
Admission: EM | Disposition: A | Discharge status: Home / Self Care | Payer: Self-pay | Source: Home / Self Care | Attending: Family Medicine

## 2017-01-05 ENCOUNTER — Encounter (HOSPITAL_COMMUNITY): Payer: Self-pay | Admitting: *Deleted

## 2017-01-05 DIAGNOSIS — I214 Non-ST elevation (NSTEMI) myocardial infarction: Secondary | ICD-10-CM | POA: Diagnosis present

## 2017-01-05 DIAGNOSIS — D62 Acute posthemorrhagic anemia: Secondary | ICD-10-CM | POA: Diagnosis present

## 2017-01-05 HISTORY — PX: ESOPHAGOGASTRODUODENOSCOPY (EGD) WITH PROPOFOL: SHX5813

## 2017-01-05 LAB — BASIC METABOLIC PANEL
ANION GAP: 5 (ref 5–15)
BUN: 21 mg/dL — ABNORMAL HIGH (ref 6–20)
CALCIUM: 8 mg/dL — AB (ref 8.9–10.3)
CO2: 22 mmol/L (ref 22–32)
Chloride: 108 mmol/L (ref 101–111)
Creatinine, Ser: 1.06 mg/dL (ref 0.61–1.24)
Glucose, Bld: 96 mg/dL (ref 65–99)
POTASSIUM: 3.8 mmol/L (ref 3.5–5.1)
Sodium: 135 mmol/L (ref 135–145)

## 2017-01-05 LAB — DIFFERENTIAL
BASOS ABS: 0 10*3/uL (ref 0.0–0.1)
BASOS PCT: 0 %
Eosinophils Absolute: 0.2 10*3/uL (ref 0.0–0.7)
Eosinophils Relative: 2 %
LYMPHS PCT: 18 %
Lymphs Abs: 1.7 10*3/uL (ref 0.7–4.0)
Monocytes Absolute: 0.9 10*3/uL (ref 0.1–1.0)
Monocytes Relative: 9 %
NEUTROS ABS: 7.1 10*3/uL (ref 1.7–7.7)
Neutrophils Relative %: 71 %

## 2017-01-05 LAB — CBC
HEMATOCRIT: 22.6 % — AB (ref 39.0–52.0)
Hemoglobin: 8 g/dL — ABNORMAL LOW (ref 13.0–17.0)
MCH: 35.1 pg — ABNORMAL HIGH (ref 26.0–34.0)
MCHC: 35.4 g/dL (ref 30.0–36.0)
MCV: 99.1 fL (ref 78.0–100.0)
PLATELETS: 120 10*3/uL — AB (ref 150–400)
RBC: 2.28 MIL/uL — AB (ref 4.22–5.81)
RDW: 18.6 % — AB (ref 11.5–15.5)
WBC: 9.9 10*3/uL (ref 4.0–10.5)

## 2017-01-05 LAB — MAGNESIUM: Magnesium: 1.8 mg/dL (ref 1.7–2.4)

## 2017-01-05 SURGERY — ESOPHAGOGASTRODUODENOSCOPY (EGD) WITH PROPOFOL
Anesthesia: Monitor Anesthesia Care

## 2017-01-05 MED ORDER — PEG 3350-KCL-NA BICARB-NACL 420 G PO SOLR
4000.0000 mL | Freq: Once | ORAL | Status: AC
  Administered 2017-01-05: 4000 mL via ORAL
  Filled 2017-01-05: qty 4000

## 2017-01-05 MED ORDER — PROPOFOL 10 MG/ML IV BOLUS
INTRAVENOUS | Status: DC | PRN
  Administered 2017-01-05: 30 mg via INTRAVENOUS

## 2017-01-05 MED ORDER — SODIUM CHLORIDE 0.9 % IV SOLN
INTRAVENOUS | Status: DC
  Administered 2017-01-05: 04:00:00 via INTRAVENOUS

## 2017-01-05 MED ORDER — PROPOFOL 500 MG/50ML IV EMUL
INTRAVENOUS | Status: DC | PRN
  Administered 2017-01-05: 100 ug/kg/min via INTRAVENOUS

## 2017-01-05 MED ORDER — LIDOCAINE HCL (CARDIAC) 20 MG/ML IV SOLN
INTRAVENOUS | Status: DC | PRN
  Administered 2017-01-05: 100 mg via INTRAVENOUS

## 2017-01-05 MED ORDER — ATORVASTATIN CALCIUM 80 MG PO TABS
80.0000 mg | ORAL_TABLET | Freq: Every day | ORAL | Status: DC
  Administered 2017-01-05 – 2017-01-15 (×10): 80 mg via ORAL
  Filled 2017-01-05 (×10): qty 1

## 2017-01-05 MED ORDER — ONDANSETRON HCL 4 MG/2ML IJ SOLN
INTRAMUSCULAR | Status: DC | PRN
  Administered 2017-01-05: 4 mg via INTRAVENOUS

## 2017-01-05 SURGICAL SUPPLY — 14 items

## 2017-01-05 NOTE — Progress Notes (Signed)
[facility] Progress Note  Stephen PeruWilliam Crawford 71 y.o. 1945-10-12  CC:   Melena, acute blood loss anemia   Subjective: Patient seen and examined endoscopy unit. No acute issues. Denied further bleeding episodes. Denied abdominal pain, nausea and vomiting.  ROS : Denied chest pain and shortness of breath today.   Objective: Vital signs in last 24 hours: Vitals:   01/05/17 1104 01/05/17 1105  BP:  137/67  Pulse:    Resp: (!) 21   Temp: 99.3 F (37.4 C)   SpO2: 100%     Physical Exam:  General:  Alert, cooperative, somewhat anxious , appears stated age        Lungs:   Clear to auscultation bilaterally, respirations unlabored  Heart:  Irregularly irregular rhythm, tachycardia   Abdomen:   Soft, non-tender, bowel sounds active all four quadrants,  no masses, no peritoneal signs   Extremities: Extremities normal, atraumatic, no  edema  Pulses: 2+ and symmetric    Lab Results: Recent Labs    01/02/17 1132  01/04/17 0059 01/05/17 0206  NA  --    < > 139 135  K  --    < > 3.8 3.8  CL  --    < > 115* 108  CO2  --    < > 22 22  GLUCOSE  --    < > 97 96  BUN  --    < > 47* 21*  CREATININE  --    < > 1.05 1.06  CALCIUM  --    < > 7.5* 8.0*  MG 1.5*  --  1.9  --    < > = values in this interval not displayed.   Recent Labs    01/04/17 0059  AST 31  ALT 17  ALKPHOS 25*  BILITOT 1.4*  PROT 5.1*  ALBUMIN 2.7*   Recent Labs    01/04/17 0059  01/04/17 1819 01/05/17 0206  WBC 10.7*   < > 11.6* 9.9  NEUTROABS 7.6  --   --  7.1  HGB 8.3*   < > 8.1* 8.0*  HCT 24.4*   < > 23.3* 22.6*  MCV 97.2   < > 97.5 99.1  PLT 126*   < > 129* 120*   < > = values in this interval not displayed.   Recent Labs    01/03/17 1030  LABPROT 15.8*  INR 1.27      Assessment/Plan: - Melena with occult blood positive stool. Resolved. Hemoglobin stable - Anemia. Multifactorial probably with dilutional as well as with GI bleed - Chest pain with elevated troponins - Atrial  fibrillation. Was started on Eliquis and amiodarone. Last dose of Eliquis was on 01/02/2017 - History of coronary artery disease. Was on Plavix. Last dose prior to admission probably on 11/09   Recommendations ----------------------- - EGD today.  If EGD negative, he may need colonoscopy for further evaluation as he will need long-term anticoagulation for his atrial fibrillation. Cardiology also planning for left heart cath pending GI evaluation.  - Monitor H&H.  - Continue Protonix for now  Risks (bleeding, infection, perforation that could require surgery, sedation-related changes in cardiopulmonary systems), benefits (identification and possible treatment of source of symptoms, exclusion of certain causes of symptoms), and alternatives (watchful waiting, radiographic imaging studies, empiric medical treatment)  were explained to patient and family in detail and patient wishes to proceed.    Kathi DerParag Dixie Coppa MD, FACP 01/05/2017, 11:16 AM  Contact #  [phone]

## 2017-01-05 NOTE — Op Note (Signed)
[facility] Patient Name: Stephen Crawford Procedure Date : 01/05/2017 MRN: [id number] Attending MD: Kathi DerParag Desma Wilkowski , MD Date of Birth: 03-02-1945 CSN: [id number] Age: 71 Admit Type: Inpatient Procedure:                Upper GI endoscopy Indications:              Heme positive stool, Melena Providers:                Kathi DerParag Riese Hellard, MD, Janae SauceKaren D. Steele BergHinson, RN, Oletha Blendavida                            Shoffner, Technician Referring MD:              Medicines:                Sedation Administered by an Anesthesia Professional Complications:            No immediate complications. Estimated Blood Loss:     Estimated blood loss was minimal. Procedure:                Pre-Anesthesia Assessment:                           - Prior to the procedure, a History and Physical                            was performed, and patient medications and                            allergies were reviewed. The patient's tolerance of                            previous anesthesia was also reviewed. The risks                            and benefits of the procedure and the sedation                            options and risks were discussed with the patient.                            All questions were answered, and informed consent                            was obtained. Prior Anticoagulants: The patient has                            taken Plavix (clopidogrel), last dose was 3 days                            prior to procedure. ASA Grade Assessment: III - A                            patient with severe systemic disease. After  reviewing the risks and benefits, the patient was                            deemed in satisfactory condition to undergo the                            procedure.                           - Prior to the procedure, a History and Physical                            was performed, and patient medications and                            allergies were  reviewed. The patient's tolerance of                            previous anesthesia was also reviewed. The risks                            and benefits of the procedure and the sedation                            options and risks were discussed with the patient.                            All questions were answered, and informed consent                            was obtained. Prior Anticoagulants: The patient has                            taken Eliquis (apixaban), last dose was 2 days                            prior to procedure. ASA Grade Assessment: III - A                            patient with severe systemic disease. After                            reviewing the risks and benefits, the patient was                            deemed in satisfactory condition to undergo the                            procedure.                           After obtaining informed consent, the endoscope was  passed under direct vision. Throughout the                            procedure, the patient's blood pressure, pulse, and                            oxygen saturations were monitored continuously. The                            [id number]-[id number] ([id number]) scope was introduced through the                            mouth, and advanced to the second part of duodenum.                            The upper GI endoscopy was accomplished without                            difficulty. The patient tolerated the procedure                            well. Scope In: Scope Out: Findings:      The Z-line was regular and was found 40 cm from the incisors.      A widely patent Schatzki ring (acquired) was found at the       gastroesophageal junction.      A few non-bleeding dispersed erosions were found in the gastric body.       Biopsies were taken with a cold forceps for Helicobacter pylori testing.      The cardia and gastric fundus were normal on retroflexion.      The duodenal bulb,  first portion of the duodenum and second portion of       the duodenum were normal. Impression:               - Z-line regular, 40 cm from the incisors.                           - Widely patent Schatzki ring.                           - Gastric erosions without bleeding. Biopsied.                           - Normal duodenal bulb, first portion of the                            duodenum and second portion of the duodenum. Moderate Sedation:      Moderate (conscious) sedation was personally administered by an       anesthesia professional. The following parameters were monitored: oxygen       saturation, heart rate, blood pressure, and response to care. Recommendation:           - Return patient to hospital ward for ongoing care.                           -  Clear liquid diet.                           - Continue present medications.                           - Await pathology results.                           - Perform a colonoscopy tomorrow. Procedure Code(s):        --- Professional ---                           [id number], Esophagogastroduodenoscopy, flexible,                            transoral; with biopsy, single or multiple Diagnosis Code(s):        --- Professional ---                           K22.2, Esophageal obstruction                           K25.9, Gastric ulcer, unspecified as acute or                            chronic, without hemorrhage or perforation                           R19.5, Other fecal abnormalities                           K92.1, Melena (includes Hematochezia) CPT copyright 2016 American Medical Association. All rights reserved. The codes documented in this report are preliminary and upon coder review may  be revised to meet current compliance requirements. Kathi DerParag Reika Callanan, MD Kathi DerParag Drake Wuertz, MD 01/05/2017 12:25:12 PM Number of Addenda: 0

## 2017-01-05 NOTE — Anesthesia Preprocedure Evaluation (Addendum)
Anesthesia Evaluation  Patient identified by MRN, date of birth, ID band Patient awake    History of Anesthesia Complications Negative for: history of anesthetic complications  Airway Mallampati: II  TM Distance: >3 FB Neck ROM: Full    Dental no notable dental hx.    Pulmonary    breath sounds clear to auscultation       Cardiovascular hypertension, + CAD and + Past MI   Rhythm:Irregular Rate:Normal     Neuro/Psych negative neurological ROS     GI/Hepatic   Endo/Other    Renal/GU negative Renal ROS     Musculoskeletal   Abdominal   Peds  Hematology  (+) anemia ,   Anesthesia Other Findings   Reproductive/Obstetrics                            Anesthesia Physical Anesthesia Plan  ASA: III  Anesthesia Plan: MAC   Post-op Pain Management:    Induction: Intravenous  PONV Risk Score and Plan: Treatment may vary due to age or medical condition  Airway Management Planned: Natural Airway and Simple Face Mask  Additional Equipment:   Intra-op Plan:   Post-operative Plan: Extubation in OR  Informed Consent: I have reviewed the patients History and Physical, chart, labs and discussed the procedure including the risks, benefits and alternatives for the proposed anesthesia with the patient or authorized representative who has indicated his/her understanding and acceptance.   Dental advisory given  Plan Discussed with: CRNA  Anesthesia Plan Comments:         Anesthesia Quick Evaluation

## 2017-01-05 NOTE — Anesthesia Postprocedure Evaluation (Signed)
Anesthesia Post Note  Patient: Stephen Crawford  Procedure(s) Performed: ESOPHAGOGASTRODUODENOSCOPY (EGD) WITH PROPOFOL (N/A )     Patient location during evaluation: Endoscopy Anesthesia Type: MAC Level of consciousness: awake and alert Pain management: pain level controlled Vital Signs Assessment: post-procedure vital signs reviewed and stable Respiratory status: spontaneous breathing, nonlabored ventilation, respiratory function stable and patient connected to nasal cannula oxygen Cardiovascular status: stable and blood pressure returned to baseline Postop Assessment: no apparent nausea or vomiting Anesthetic complications: no    Last Vitals:  Vitals:   01/05/17 1223 01/05/17 1235  BP: (!) 84/62 (!) 112/55  Pulse:    Resp: 20 18  Temp: 36.9 C   SpO2: 100% 100%    Last Pain:  Vitals:   01/05/17 1223  TempSrc: Oral  PainSc:                  Senora Lacson,JAMES TERRILL

## 2017-01-05 NOTE — Transfer of Care (Signed)
Immediate Anesthesia Transfer of Care Note  Patient: Elray Buba  Procedure(s) Performed: ESOPHAGOGASTRODUODENOSCOPY (EGD) WITH PROPOFOL (N/A )  Patient Location: Endoscopy Unit  Anesthesia Type:MAC  Level of Consciousness: awake, alert  and oriented  Airway & Oxygen Therapy: Patient Spontanous Breathing and Patient connected to nasal cannula oxygen  Post-op Assessment: Report given to RN and Post -op Vital signs reviewed and stable  Post vital signs: Reviewed and stable  Last Vitals:  Vitals:   01/05/17 1104 01/05/17 1105  BP:  137/67  Pulse:    Resp: (!) 21   Temp: 37.4 C   SpO2: 100%     Last Pain:  Vitals:   01/05/17 1104  TempSrc: Oral  PainSc:          Complications: No apparent anesthesia complications

## 2017-01-05 NOTE — Progress Notes (Addendum)
Progress Note  Patient Name: Stephen PeruWilliam Crawford Date of Encounter: 01/05/2017  Primary Cardiologist: Dr. Turner Danielsowan?  Subjective   Feeling well today. No chest pain or shortness of breath. Awaiting EGD today.  Inpatient Medications    Scheduled Meds: . amiodarone  400 mg Oral BID  . atorvastatin  20 mg Oral Daily  . escitalopram  10 mg Oral Daily  . pantoprazole (PROTONIX) IV  40 mg Intravenous Q12H  . senna  1 tablet Oral BID  . sodium chloride flush  3 mL Intravenous Q12H   Continuous Infusions: . sodium chloride    . sodium chloride    . sodium chloride 20 mL/hr at 01/05/17 0351  . pantoprozole (PROTONIX) infusion 8 mg/hr (01/05/17 0754)   PRN Meds: sodium chloride, acetaminophen **OR** acetaminophen, albuterol, alum & mag hydroxide-simeth, nitroGLYCERIN, ondansetron **OR** ondansetron (ZOFRAN) IV, polyethylene glycol, sodium chloride flush   Vital Signs    Vitals:   01/04/17 2127 01/04/17 2326 01/05/17 0628 01/05/17 0800  BP:  108/70 121/65 120/66  Pulse: 91 85 96 89  Resp: 20 19 20 19   Temp:  98.3 F (36.8 C) 98.2 F (36.8 C)   TempSrc:  Oral Oral   SpO2: 100% 100% 100% 100%  Weight:   160 lb 3.2 oz (72.7 kg)   Height:        Intake/Output Summary (Last 24 hours) at 01/05/2017 0821 Last data filed at 01/05/2017 [id number] Gross per 24 hour  Intake 1030 ml  Output 2600 ml  Net -1570 ml   Filed Weights   01/02/17 0820 01/03/17 0315 01/05/17 0628  Weight: 177 lb (80.3 kg) 162 lb 4.8 oz (73.6 kg) 160 lb 3.2 oz (72.7 kg)    Telemetry    NSR/sinus tach occ PAC/PVC - Personally Reviewed  Physical Exam   GEN: No acute distress. Pale HEENT: Normocephalic, atraumatic, sclera non-icteric. R facial ecchymosis Neck: No JVD or bruits. Cardiac: RRR no murmurs, rubs, or gallops.  Radials/DP/PT 1+ and equal bilaterally.  Respiratory: Clear to auscultation bilaterally. Breathing is unlabored. GI: Soft, nontender, non-distended, BS +x 4. MS: no deformity. Extremities: No  clubbing or cyanosis. No edema. Distal pedal pulses are 2+ and equal bilaterally. Neuro:  AAOx3. Follows commands. Psych:  Responds to questions appropriately with a normal affect.  Labs    Chemistry Recent Labs  Lab 01/03/17 0258 01/04/17 0059 01/05/17 0206  NA 138 139 135  K 4.1 3.8 3.8  CL 113* 115* 108  CO2 19* 22 22  GLUCOSE 110* 97 96  BUN 70* 47* 21*  CREATININE 1.17 1.05 1.06  CALCIUM 8.2* 7.5* 8.0*  PROT  --  5.1*  --   ALBUMIN  --  2.7*  --   AST  --  31  --   ALT  --  17  --   ALKPHOS  --  25*  --   BILITOT  --  1.4*  --   GFRNONAA >60 >60 >60  GFRAA >60 >60 >60  ANIONGAP 6 2* 5     Hematology Recent Labs  Lab 01/04/17 1129 01/04/17 1819 01/05/17 0206  WBC 10.6* 11.6* 9.9  RBC 2.39* 2.39* 2.28*  HGB 8.3* 8.1* 8.0*  HCT 23.7* 23.3* 22.6*  MCV 99.2 97.5 99.1  MCH 34.7* 33.9 35.1*  MCHC 35.0 34.8 35.4  RDW 19.3* 18.4* 18.6*  PLT 121* 129* 120*    Cardiac Enzymes Recent Labs  Lab 01/03/17 0258 01/03/17 1147 01/04/17 0058 01/04/17 0621  TROPONINI 0.38* 0.13* 2.07* 2.42*  Recent Labs  Lab 01/02/17 0826  TROPIPOC 0.05     BNPNo results for input(s): BNP, PROBNP in the last 168 hours.   DDimer No results for input(s): DDIMER in the last 168 hours.   Radiology    No results found.  Cardiac Studies   2D echo Study Conclusions - Left ventricle: The cavity size was normal. Wall thickness was increased in a pattern of mild LVH. Systolic function was vigorous. The estimated ejection fraction was in the range of 65% to 70%. Wall motion was normal; there were no regional wall motion abnormalities. Doppler parameters are consistent with abnormal left ventricular relaxation (grade 1 diastolic dysfunction). - Aortic valve: Mildly calcified annulus. A bicuspid morphology cannot be excluded; mildly calcified leaflets. - Right atrium: Central venous pressure (est): 3 mm Hg. - Tricuspid valve: There was trivial regurgitation. -  Pulmonary arteries: PA peak pressure: 29 mm Hg (S). - Pericardium, extracardiac: There was no pericardial effusion. Impressions: - Mild LVH with LVEF 65-70% and grade 1 diastolic dysfunction. Mildly calcified aortic annulus, cannot exclude bicuspid morphology with mildly calcified leaflets. Trivial tricuspid regurgitation with estimated PASP 29 mmHg.   Patient Profile     71 y.o. male with CAD s/p DESx2 to LAD and FFR PCI to Cx 2015, HTN, HLD, former tobacco abuse admitted with 2 syncopal episodes, found to have new onset atrial fib, bifascicular block, and GI bleeding with ABL anemia c/b severe orthostasis and troponin elevation to 2.4.  Assessment & Plan    1. Syncope - thought initially to be micturition syncope (cannot r/o rhythm issue due to bifascicular block) but then had recurrent syncope in hospital 11/11 in setting of severe orthostasis with progressive anemia. Avoiding all antihypertensives right now. Unclear if he will need midodrine or florinef in the future after anemia issues stabilized. May also need event monitor after dc for completeness.  2. Paroxysmal atrial fib - newly recognized. CHADSVASC is 3 for HTN, age, vasc disease so would warrant anticoag however has ABL anemia/GIB this admission thus this is on hold. Currently on amiodarone and holding NSR (day 4 of 400mg  BID). Consider reducing to 200mg  BID after 7 days.  3. ABL anemia/GIB/melena - s/p 2 U PRBC, for EGD today.  4. NSTEMI - prior hx of CAD as above. Prior notes indicate h/o ostial RCA narrowing in the past but do not quantify. On ASA/Plavix PTA.  5. Acute kidney injury - improved.  6. Hyperlipidemia -pt denies prior h/o issues with higher intensity statin. Will titrate and check lipids in AM for baseline.  Signed, Ronie Spiesayna Blade Scheff PA-C (pager [phone]) 01/05/2017, 8:21 AM

## 2017-01-05 NOTE — Anesthesia Procedure Notes (Signed)
Procedure Name: MAC Date/Time: 01/05/2017 12:14 PM Performed by: Teressa Lower., CRNA Pre-anesthesia Checklist: Patient identified, Emergency Drugs available, Suction available, Patient being monitored and Timeout performed Oxygen Delivery Method: Nasal cannula

## 2017-01-05 NOTE — Progress Notes (Signed)
PROGRESS NOTE    Stephen Crawford  ACZ:[id number] DOB: November 18, 1945 DOA: 01/02/2017 PCP: System, Pcp Not In  Brief Narrative:  71 year old male with history of CAD, MI, stenting in 2015 admitted with syncopal episodes x2 at home. But this was associated with nausea shortness of breath and lightheadedness. He reported both the incidents after passing urine. He hit his back of his head and bruising bruised his back of his head and has ecchymosis on the right side of his face. When he came to the emergency room his heart rate was in on Cardizem drip and he was converted to sinus rhythm. He has no history of previous syncopal episodes. He was otherwise in his usual state of health. He denies any chest pain though his daughter states that he has some chest discomfort. He has a history of ischemic heart disease and LAD PCI in 2015. Echocardiogram in 2015 showed normal LV function. Today when I saw him patient continues to have nausea with no vomiting,patient also reports that he had diarrhea which was black in color. He stood up to urinate early this morning and had another syncopal event in the hospital. His orthostatics were done this morning and his systolic blood pressure dropped significantly from 123-80. His systolic blood pressure was 123 laying down and 80 standing up. And he was his heart rate jumped up to 20 beats when he was standing. He was started on IV hydration right away with 250 cc bolus. Stools are dark and gauiac positive.he received 2 units blood transfusion on 11th. Patient did not have any more syncope since yesterday morning after fluid boluses and blood transfusion given.he denies dizziness.he was orthosstatic prior to iv hydration. He had chest pain last nite releived with 2 nitros.his trop peaked at 2.42.he is now sinus tachy.on and off afib.on amio. Patient reports feeling a lot better today has not had any more episodes of lightheadedness or nausea or syncope.  He had EGD  today which revealed widely patent Schatzki ring at the GE junction, nonbleeding disposed erosions of the gastric body, cardia and gastric fundus are normal duodenum was normal biopsies were done for H. pylori testing.  Gastric erosions seen without bleeding   Assessment & Plan:   Principal Problem:   New onset a-fib with RVR Active Problems:   Syncope and collapse   CAD/stents   HTN (hypertension)   Non-ST elevation (NSTEMI) myocardial infarction (HCC)   Acute blood loss anemia  1] new onset atrial fibrillation with RVR status post Cardizem.  Currently on amiodarone.  Patient converted to normal sinus rhythm at this time.  Patient was started on Eliquis at the time of admission which had to be DC'd due to GI bleed.  Patient had guaiac positive stools and drop in hemoglobin from 13-9.3.  Patient followed by cardiology.  Echocardiogram shows normal ejection fraction.  Normal TSH.  Patient to have cardiac cath at some point prior to discharge. 2] GI bleed status post 2 units of blood transfusion and IV boluses.  EGD shows nonbleeding gastric erosions which were biopsied.  Patient to have colonoscopy tomorrow.  Continue Protonix IV. 3] leukocytosis improved without antibiotics. 4] history of hypertension patient is not on any antihypertensives at this time due to soft blood pressure. 5] AK I improved after IV hydration and back to normal.    DVT prophylaxis: SCD Code Status full code :Family Communication none : Disposition Plan: TBD patient to have colonoscopy tomorrow and cardiac workup after that. Consultants  GI and cardiology  Procedures: EGD Antimicrobials: None Subjective: CC feels much better nausea is gone has not had any more chest pain or shortness of breath. Objective: Resting in bed smiling in no acute distress Vitals:   01/05/17 1104 01/05/17 1105 01/05/17 1223 01/05/17 1235  BP:  137/67 (!) 84/62 (!) 112/55  Pulse:      Resp: (!) 21  20 18   Temp: 99.3 F (37.4 C)   98.4 F (36.9 C)   TempSrc: Oral  Oral   SpO2: 100%  100% 100%  Weight:      Height:        Intake/Output Summary (Last 24 hours) at 01/05/2017 1326 Last data filed at 01/05/2017 1222 Gross per 24 hour  Intake 440 ml  Output 2400 ml  Net -1960 ml   Filed Weights   01/02/17 0820 01/03/17 0315 01/05/17 0628  Weight: 80.3 kg (177 lb) 73.6 kg (162 lb 4.8 oz) 72.7 kg (160 lb 3.2 oz)    Examination:  General exam: Appears calm and comfortable  Respiratory system: Clear to auscultation. Respiratory effort normal. Cardiovascular system: S1 & S2 heard, RRR. No JVD, murmurs, rubs, gallops or clicks. No pedal edema. Gastrointestinal system: Abdomen is nondistended, soft and nontender. No organomegaly or masses felt. Normal bowel sounds heard. Central nervous system: Alert and oriented. No focal neurological deficits. Extremities: Symmetric 5 x 5 power. Skin: No rashes, lesions or ulcers Psychiatry: Judgement and insight appear normal. Mood & affect appropriate.     Data Reviewed: I have personally reviewed following labs and imaging studies  CBC: Recent Labs  Lab 01/04/17 0059 01/04/17 0621 01/04/17 1129 01/04/17 1819 01/05/17 0206  WBC 10.7* 11.1* 10.6* 11.6* 9.9  NEUTROABS 7.6  --   --   --  7.1  HGB 8.3* 8.2* 8.3* 8.1* 8.0*  HCT 24.4* 23.9* 23.7* 23.3* 22.6*  MCV 97.2 98.0 99.2 97.5 99.1  PLT 126* 120* 121* 129* 120*   Basic Metabolic Panel: Recent Labs  Lab 01/02/17 0823 01/02/17 1132 01/02/17 1558 01/03/17 0258 01/04/17 0059 01/05/17 0206  NA 133*  --  138 138 139 135  K 4.5  --  4.1 4.1 3.8 3.8  CL 106  --  113* 113* 115* 108  CO2 16*  --  21* 19* 22 22  GLUCOSE 122*  --  106* 110* 97 96  BUN 82*  --  75* 70* 47* 21*  CREATININE 1.24  --  1.11 1.17 1.05 1.06  CALCIUM 9.0  --  8.3* 8.2* 7.5* 8.0*  MG  --  1.5*  --   --  1.9  --    GFR: Estimated Creatinine Clearance: 62.1 mL/min (by C-G formula based on SCr of 1.06 mg/dL). Liver Function  Tests: Recent Labs  Lab 01/04/17 0059  AST 31  ALT 17  ALKPHOS 25*  BILITOT 1.4*  PROT 5.1*  ALBUMIN 2.7*   No results for input(s): LIPASE, AMYLASE in the last 168 hours. No results for input(s): AMMONIA in the last 168 hours. Coagulation Profile: Recent Labs  Lab 01/02/17 0823 01/03/17 1030  INR 1.08 1.27   Cardiac Enzymes: Recent Labs  Lab 01/02/17 2240 01/03/17 0258 01/03/17 1147 01/04/17 0058 01/04/17 0621  TROPONINI 0.52* 0.38* 0.13* 2.07* 2.42*   BNP (last 3 results) No results for input(s): PROBNP in the last 8760 hours. HbA1C: No results for input(s): HGBA1C in the last 72 hours. CBG: No results for input(s): GLUCAP in the last 168 hours. Lipid Profile: No results for input(s): CHOL, HDL, LDLCALC,  TRIG, CHOLHDL, LDLDIRECT in the last 72 hours. Thyroid Function Tests: No results for input(s): TSH, T4TOTAL, FREET4, T3FREE, THYROIDAB in the last 72 hours. Anemia Panel: No results for input(s): VITAMINB12, FOLATE, FERRITIN, TIBC, IRON, RETICCTPCT in the last 72 hours. Sepsis Labs: No results for input(s): PROCALCITON, LATICACIDVEN in the last 168 hours.  Recent Results (from the past 240 hour(s))  Culture, blood (Routine X 2) w Reflex to ID Panel     Status: None (Preliminary result)   Collection Time: 01/02/17 12:35 PM  Result Value Ref Range Status   Specimen Description BLOOD RIGHT ARM  Final   Special Requests   Final    BOTTLES DRAWN AEROBIC AND ANAEROBIC Blood Culture results may not be optimal due to an excessive volume of blood received in culture bottles   Culture NO GROWTH 3 DAYS  Final   Report Status PENDING  Incomplete  Culture, blood (Routine X 2) w Reflex to ID Panel     Status: None (Preliminary result)   Collection Time: 01/02/17 12:50 PM  Result Value Ref Range Status   Specimen Description BLOOD LEFT ARM  Final   Special Requests   Final    BOTTLES DRAWN AEROBIC AND ANAEROBIC Blood Culture results may not be optimal due to an  excessive volume of blood received in culture bottles   Culture NO GROWTH 3 DAYS  Final   Report Status PENDING  Incomplete  MRSA PCR Screening     Status: None   Collection Time: 01/03/17  1:13 PM  Result Value Ref Range Status   MRSA by PCR NEGATIVE NEGATIVE Final    Comment:        The GeneXpert MRSA Assay (FDA approved for NASAL specimens only), is one component of a comprehensive MRSA colonization surveillance program. It is not intended to diagnose MRSA infection nor to guide or monitor treatment for MRSA infections.          Radiology Studies: No results found.      Scheduled Meds: . amiodarone  400 mg Oral BID  . atorvastatin  80 mg Oral Daily  . escitalopram  10 mg Oral Daily  . pantoprazole (PROTONIX) IV  40 mg Intravenous Q12H  . polyethylene glycol-electrolytes  4,000 mL Oral Once  . senna  1 tablet Oral BID  . sodium chloride flush  3 mL Intravenous Q12H   Continuous Infusions: . sodium chloride Stopped (01/05/17 1222)  . sodium chloride    . pantoprozole (PROTONIX) infusion 8 mg/hr (01/05/17 0754)     LOS: 3 days      Alwyn Ren, MD [facility]  If 7PM-7AM, please contact night-coverage [email or web address] Password [email or web address] 01/05/2017, 1:26 PM

## 2017-01-05 NOTE — Plan of Care (Signed)
  Progressing Pain Managment: General experience of comfort will improve 01/05/2017 0428 - Progressing by Mellody DrownKuzmick, Hind Chesler, RN Safety: Ability to remain free from injury will improve 01/05/2017 0428 - Progressing by Mellody DrownKuzmick, Rheda Kassab, RN Cardiac: Ability to achieve and maintain adequate cardiopulmonary perfusion will improve 01/05/2017 0428 - Progressing by Mellody DrownKuzmick, Liyanna Cartwright, RN

## 2017-01-06 ENCOUNTER — Inpatient Hospital Stay (HOSPITAL_COMMUNITY): Payer: Medicare HMO | Admitting: Certified Registered Nurse Anesthetist

## 2017-01-06 ENCOUNTER — Encounter (HOSPITAL_COMMUNITY)
Admission: EM | Disposition: A | Discharge status: Home / Self Care | Payer: Self-pay | Source: Home / Self Care | Attending: Family Medicine

## 2017-01-06 ENCOUNTER — Encounter (HOSPITAL_COMMUNITY): Payer: Self-pay

## 2017-01-06 HISTORY — PX: COLONOSCOPY WITH PROPOFOL: SHX5780

## 2017-01-06 LAB — LIPID PANEL
CHOL/HDL RATIO: 3.2 ratio
Cholesterol: 73 mg/dL (ref 0–200)
HDL: 23 mg/dL — ABNORMAL LOW (ref >40)
LDL Cholesterol: 41 mg/dL (ref 0–99)
Triglycerides: 45 mg/dL (ref <150)
VLDL: 9 mg/dL (ref 0–40)

## 2017-01-06 LAB — CBC WITH DIFFERENTIAL/PLATELET
Basophils Absolute: 0 10*3/uL (ref 0.0–0.1)
Basophils Relative: 0 %
Eosinophils Absolute: 0.2 10*3/uL (ref 0.0–0.7)
Eosinophils Relative: 3 %
HCT: 22.7 % — ABNORMAL LOW (ref 39.0–52.0)
Hemoglobin: 7.9 g/dL — ABNORMAL LOW (ref 13.0–17.0)
LYMPHS ABS: 1.6 10*3/uL (ref 0.7–4.0)
LYMPHS PCT: 20 %
MCH: 34.8 pg — AB (ref 26.0–34.0)
MCHC: 34.8 g/dL (ref 30.0–36.0)
MCV: 100 fL (ref 78.0–100.0)
MONO ABS: 0.6 10*3/uL (ref 0.1–1.0)
MONOS PCT: 8 %
Neutro Abs: 5.3 10*3/uL (ref 1.7–7.7)
Neutrophils Relative %: 69 %
PLATELETS: 133 10*3/uL — AB (ref 150–400)
RBC: 2.27 MIL/uL — AB (ref 4.22–5.81)
RDW: 17.7 % — ABNORMAL HIGH (ref 11.5–15.5)
WBC: 7.8 10*3/uL (ref 4.0–10.5)

## 2017-01-06 LAB — BASIC METABOLIC PANEL
Anion gap: 5 (ref 5–15)
BUN: 14 mg/dL (ref 6–20)
CHLORIDE: 110 mmol/L (ref 101–111)
CO2: 22 mmol/L (ref 22–32)
Calcium: 8.2 mg/dL — ABNORMAL LOW (ref 8.9–10.3)
Creatinine, Ser: 1.2 mg/dL (ref 0.61–1.24)
GFR calc Af Amer: >60 mL/min (ref >60)
GFR calc non Af Amer: 58 mL/min — ABNORMAL LOW (ref >60)
GLUCOSE: 99 mg/dL (ref 65–99)
POTASSIUM: 3.5 mmol/L (ref 3.5–5.1)
Sodium: 137 mmol/L (ref 135–145)

## 2017-01-06 SURGERY — COLONOSCOPY WITH PROPOFOL
Anesthesia: Monitor Anesthesia Care

## 2017-01-06 MED ORDER — PROPOFOL 10 MG/ML IV BOLUS
INTRAVENOUS | Status: DC | PRN
  Administered 2017-01-06 (×3): 25 mg via INTRAVENOUS
  Administered 2017-01-06: 75 mg via INTRAVENOUS

## 2017-01-06 MED ORDER — ONDANSETRON HCL 4 MG/2ML IJ SOLN: 4.0000 mg | Freq: Once | INTRAMUSCULAR | Status: DC | PRN

## 2017-01-06 MED ORDER — PANTOPRAZOLE SODIUM 40 MG PO TBEC: 40.0000 mg | DELAYED_RELEASE_TABLET | Freq: Two times a day (BID) | ORAL | Status: DC

## 2017-01-06 MED ORDER — PANTOPRAZOLE SODIUM 40 MG PO TBEC
40.0000 mg | DELAYED_RELEASE_TABLET | Freq: Two times a day (BID) | ORAL | Status: DC
  Administered 2017-01-06 – 2017-01-10 (×9): 40 mg via ORAL
  Filled 2017-01-06 (×9): qty 1

## 2017-01-06 MED ORDER — LIDOCAINE HCL 2 % EX GEL
1.0000 "application " | Freq: Once | CUTANEOUS | Status: DC
  Filled 2017-01-06: qty 5

## 2017-01-06 MED ORDER — SODIUM CHLORIDE 0.9 % IV SOLN
INTRAVENOUS | Status: DC
  Administered 2017-01-06: 09:00:00 via INTRAVENOUS

## 2017-01-06 MED ORDER — LACTATED RINGERS IV SOLN
INTRAVENOUS | Status: DC | PRN
  Administered 2017-01-06 (×2): via INTRAVENOUS

## 2017-01-06 MED ORDER — PHENYLEPHRINE 40 MCG/ML (10ML) SYRINGE FOR IV PUSH (FOR BLOOD PRESSURE SUPPORT)
PREFILLED_SYRINGE | INTRAVENOUS | Status: DC | PRN
  Administered 2017-01-06: 120 ug via INTRAVENOUS
  Administered 2017-01-06: 80 ug via INTRAVENOUS
  Administered 2017-01-06 (×2): 40 ug via INTRAVENOUS

## 2017-01-06 SURGICAL SUPPLY — 21 items

## 2017-01-06 NOTE — Anesthesia Procedure Notes (Signed)
Procedure Name: MAC Date/Time: 01/06/2017 10:00 AM Performed by: Lowella Dell, CRNA Pre-anesthesia Checklist: Patient identified, Emergency Drugs available, Suction available, Patient being monitored and Timeout performed Patient Re-evaluated:Patient Re-evaluated prior to induction Oxygen Delivery Method: Simple face mask Induction Type: IV induction Placement Confirmation: positive ETCO2 Dental Injury: Teeth and Oropharynx as per pre-operative assessment

## 2017-01-06 NOTE — Op Note (Signed)
[facility] Patient Name: Stephen Crawford Procedure Date : 01/06/2017 MRN: [id number] Attending MD: Kathi Der , MD Date of Birth: 02-Aug-1945 CSN: [id number] Age: 71 Admit Type: Inpatient Procedure:                Colonoscopy Indications:              Heme positive stool Providers:                Kathi Der, MD, Tomma Rakers, RN, Oletha Blend, Technician Referring MD:              Medicines:                Sedation Administered by an Anesthesia Professional Complications:            No immediate complications. Estimated Blood Loss:     Estimated blood loss: none. Procedure:                Pre-Anesthesia Assessment:                           - Prior to the procedure, a History and Physical                            was performed, and patient medications and                            allergies were reviewed. The patient's tolerance of                            previous anesthesia was also reviewed. The risks                            and benefits of the procedure and the sedation                            options and risks were discussed with the patient.                            All questions were answered, and informed consent                            was obtained. Prior Anticoagulants: The patient has                            taken Plavix (clopidogrel), last dose was 4 days                            prior to procedure. ASA Grade Assessment: III - A                            patient with severe systemic disease. After  reviewing the risks and benefits, the patient was                            deemed in satisfactory condition to undergo the                            procedure.                           After obtaining informed consent, the colonoscope                            was passed under direct vision. Throughout the                            procedure, the patient's blood  pressure, pulse, and                            oxygen saturations were monitored continuously. The                            EC-[id number] ([id number] scope was introduced through                            the anus and advanced to the the cecum, identified                            by appendiceal orifice and ileocecal valve. The                            colonoscopy was technically difficult and complex                            due to a tortuous colon. Successful completion of                            the procedure was aided by applying abdominal                            pressure. The patient tolerated the procedure well.                            The quality of the bowel preparation was fair. The                            ileocecal valve, appendiceal orifice, and rectum                            were photographed. The quality of the bowel                            preparation was fair. Scope In: 10:02:07 AM Scope Out: 10:28:31 AM Scope Withdrawal Time: 0 hours 11 minutes 8 seconds  Total Procedure Duration: 0 hours 26  minutes 24 seconds  Findings:      The perianal and digital rectal examinations were normal.      Multiple small and large-mouthed diverticula were found in the sigmoid       colon.      Internal hemorrhoids were found during retroflexion. The hemorrhoids       were small.      There is no endoscopic evidence of bleeding in the entire colon. Impression:               - Preparation of the colon was fair.                           - Severe diverticulosis in the sigmoid colon.                           - Internal hemorrhoids.                           - No specimens collected. Moderate Sedation:      Moderate (conscious) sedation was personally administered by an       anesthesia professional. The following parameters were monitored: oxygen       saturation, heart rate, blood pressure, and response to care. Recommendation:           - Return patient to hospital  ward for ongoing care.                           - Soft diet.                           - Continue present medications.                           - Repeat colonoscopy as recommended by primary                            gastroenterologist.(Depending on last colonoscopy                            exam which was few years ago according to patient) Procedure Code(s):        --- Professional ---                           [phone], Colonoscopy, flexible; diagnostic, including                            collection of specimen(s) by brushing or washing,                            when performed (separate procedure) Diagnosis Code(s):        --- Professional ---                           U04.5, Other hemorrhoids                           R19.5, Other fecal abnormalities  K57.30, Diverticulosis of large intestine without                            perforation or abscess without bleeding CPT copyright 2016 [employer]. All rights reserved. The codes documented in this report are preliminary and upon coder review may  be revised to meet current compliance requirements. Kathi DerParag Alzina Golda, MD Kathi DerParag Kynesha Guerin, MD 01/06/2017 10:37:32 AM Number of Addenda: 0

## 2017-01-06 NOTE — Progress Notes (Signed)
Stephen Crawford is a 71 y.o. male has presented with GI bleed   The various methods of treatment have been discussed with the patient and family. After consideration of risks, benefits and other options for treatment, the patient has consented to  Procedure(s): Colonoscopy  as a surgical intervention .  The patient's history has been reviewed, patient examined, no change in status, stable for surgery.  I have reviewed the patient's chart and labs.  Questions were answered to the patient's satisfaction.   Risks (bleeding, infection, bowel perforation that could require surgery, sedation-related changes in cardiopulmonary systems), benefits (identification and possible treatment of source of symptoms, exclusion of certain causes of symptoms), and alternatives (watchful waiting, radiographic imaging studies, empiric medical treatment)  were explained to patient/family in detail and patient wishes to proceed.  Kathi DerParag Japleen Tornow MD, FACP 01/06/2017, 9:55 AM  Contact #  [phone]

## 2017-01-06 NOTE — Anesthesia Preprocedure Evaluation (Addendum)
Anesthesia Evaluation  Patient identified by MRN, date of birth, ID band Patient awake    Reviewed: Allergy & Precautions, NPO status , Patient's Chart, lab work & pertinent test results  Airway Mallampati: I  TM Distance: >3 FB Neck ROM: Full    Dental  (+) Edentulous Upper, Edentulous Lower   Pulmonary neg pulmonary ROS,    Pulmonary exam normal breath sounds clear to auscultation       Cardiovascular hypertension, Pt. on medications and Pt. on home beta blockers + CAD, + Past MI and + Cardiac Stents  Normal cardiovascular exam+ dysrhythmias Atrial Fibrillation  Rhythm:Regular Rate:Normal  Non STEMI 01/02/2017 Hx/o Syncope 11/10 Hx/o Atrial Fibrillation with RVR Hx/o Perioperative MI - S/P stent x 5 09/2013 [facility]  Echo 01/03/17- Left ventricle: The cavity size was normal. Wall thickness was increased in a pattern of mild LVH. Systolic function was vigorous. The estimated ejection fraction was in the range of 65% to 70%. Wall motion was normal; there were no regional wall  motion abnormalities. Doppler parameters are consistent with abnormal left ventricular relaxation (grade 1 diastolic  dysfunction). - Aortic valve: Mildly calcified annulus. A bicuspid morphology cannot be excluded; mildly calcified leaflets. - Right atrium: Central venous pressure (est): 3 mm Hg. - Tricuspid valve: There was trivial regurgitation. - Pulmonary arteries: PA peak pressure: 29 mm Hg (S). - Pericardium, extracardiac: There was no pericardial effusion   Neuro/Psych negative neurological ROS  negative psych ROS   GI/Hepatic Neg liver ROS, GI bleeding   Endo/Other  Hyperlipidemia  Renal/GU negative Renal ROS  negative genitourinary   Musculoskeletal  (+) Arthritis , Osteoarthritis,  Knees   Abdominal   Peds  Hematology  (+) anemia , Acute blood loss anemia Hx/o Plavix use- last dose 01/02/2017 Thrombocytopenia    Anesthesia Other Findings   Reproductive/Obstetrics                            Anesthesia Physical Anesthesia Plan  ASA: III  Anesthesia Plan: MAC   Post-op Pain Management:    Induction: Intravenous  PONV Risk Score and Plan: 1 and Ondansetron and Treatment may vary due to age or medical condition  Airway Management Planned: Natural Airway and Simple Face Mask  Additional Equipment:   Intra-op Plan:   Post-operative Plan:   Informed Consent: I have reviewed the patients History and Physical, chart, labs and discussed the procedure including the risks, benefits and alternatives for the proposed anesthesia with the patient or authorized representative who has indicated his/her understanding and acceptance.     Plan Discussed with: CRNA, Anesthesiologist and Surgeon  Anesthesia Plan Comments:         Anesthesia Quick Evaluation

## 2017-01-06 NOTE — Anesthesia Postprocedure Evaluation (Signed)
Anesthesia Post Note  Patient: Stephen Crawford  Procedure(s) Performed: COLONOSCOPY WITH PROPOFOL (N/A )     Patient location during evaluation: PACU Anesthesia Type: MAC Level of consciousness: awake and alert and oriented Pain management: pain level controlled Vital Signs Assessment: post-procedure vital signs reviewed and stable Respiratory status: spontaneous breathing, nonlabored ventilation and respiratory function stable Cardiovascular status: stable and blood pressure returned to baseline Postop Assessment: no apparent nausea or vomiting Anesthetic complications: no    Last Vitals:  Vitals:   01/06/17 1032 01/06/17 1037  BP: (!) 54/36 (!) 104/59  Pulse:  90  Resp: 19 19  Temp: 36.5 C   SpO2: 100% 100%    Last Pain:  Vitals:   01/06/17 1032  TempSrc: Oral  PainSc:                  Maythe Deramo A.

## 2017-01-06 NOTE — Brief Op Note (Signed)
01/02/2017 - 01/06/2017  10:37 AM  PATIENT:  Dory PeruWilliam Ozimek  71 y.o. male  PRE-OPERATIVE DIAGNOSIS:  GI bleed  POST-OPERATIVE DIAGNOSIS:  diverticulosis, hemorrhoids  PROCEDURE:  Procedure(s): COLONOSCOPY WITH PROPOFOL (N/A)  SURGEON:  Surgeon(s) and Role:    * Minahil Quinlivan, MD - Primary  Findings/recommendations ------------------------------------ - Colonoscopy today showed fair prep, multiple sigmoid diverticulosis, internal hemorrhoids but no evidence of active bleeding. - Okay to start anticoagulation/antiplatelet from GI standpoint. - Recommend repeat colonoscopy as previously  recommended by his primary gastroenterologist  because of fair prep today. - GI will sign off. Call us back if needed  Kathi DerParag Jodilyn Giese MD, FACP 01/06/2017, 10:39 AM  Contact #  [phone]

## 2017-01-06 NOTE — Progress Notes (Signed)
Paged MD. Patient remains with 850 ml in bladder. In and out cath unsuccessful and caused bleeding on day shift. Patient remains unable to void, only scant amounts (50 ml) at most

## 2017-01-06 NOTE — Transfer of Care (Signed)
Immediate Anesthesia Transfer of Care Note  Patient: Stephen Crawford  Procedure(s) Performed: COLONOSCOPY WITH PROPOFOL (N/A )  Patient Location: PACU and Endoscopy Unit  Anesthesia Type:MAC  Level of Consciousness: awake, alert , oriented and patient cooperative  Airway & Oxygen Therapy: Patient Spontanous Breathing and Patient connected to face mask oxygen  Post-op Assessment: Report given to RN and Post -op Vital signs reviewed and stable   (initially pt hypotensive, Dr Royce Macadamia called and Neosynephrine 58mg bolus administered. Currently stable, resting comfortably)  Post vital signs: Reviewed and stable  Last Vitals:  Vitals:   01/06/17 1032 01/06/17 1037  BP: (!) 54/36 (!) 104/59  Pulse:  90  Resp: 19 19  Temp: 36.5 C   SpO2: 100% 100%    Last Pain:  Vitals:   01/06/17 1032  TempSrc: Oral  PainSc:          Complications: No apparent anesthesia complications

## 2017-01-06 NOTE — Progress Notes (Signed)
MD Nettey notified that pt has been unable to empty urine after returning from colonoscopy. Pt able to void minimal amounts, bladder scanned for . Orders received to in and out cath. RN meeting resistance upon straight cath attempt. MD aware that RN is unable to perform straight cath without causing trauma.

## 2017-01-06 NOTE — Progress Notes (Addendum)
PROGRESS NOTE    Stephen Crawford  [id number] DOB: [birth year] DOA: 01/02/2017 PCP: System, Pcp Not In   Brief Narrative: Stephen Crawford is a 71 y.o. male with a history of CAD, MI status post stents times 07/12/2013.  He presented after having 2 syncopal episodes at home associated facial trauma.  He is found to have A. fib and RVR.  He was started on Cardizem drip and switched to amiodarone.  He was also started on Eliquis but subsequently suffered GI bleeding.  Eliquis was discontinued and GI consulted for evaluation of upper versus lower GI bleed.  He required 1 units of blood transfusions to date.  EGD was performed on 11/13 and was significant for nonbleeding gastric erosions.  Colonoscopy is planned for 11/14.  Cardiology plans on performing a heart cath week prior to discharge.  Patient has no recurrent GI bleeding.  Hemoglobin is slightly down but appears stable.   Assessment & Plan:   Principal Problem:   New onset a-fib with RVR Active Problems:   Syncope and collapse   CAD/stents   HTN (hypertension)   Non-ST elevation (NSTEMI) myocardial infarction (HCC)   Acute blood loss anemia   Atrial fibrillation with RVR Patient with a CHA2DS2-VASc Score of 3.  Patient was initially started on Eliquis but discontinued secondary to GI bleeding. Rate is controlled on amiodarone. -Cardiology recommendations: cardiac cath planned this week; hold anticoagulation -Continue amiodarone  Syncope Concerned this could be cardiogenic.  It seems that this may have been secondary to atrial fibrillation.  Patient with soft blood pressures in the setting of taking antihypertensives, which may also contributed.  Cardiology consulted as above.  Acute GI bleeding Acute blood loss anemia Hemoglobin dropped from about 13 to 9.3 over a 24 hour period and has been as low as 7.6, requiring 1 unit of PRBC on January 03, 2017. -repeat CBC in AM, transfuse if continues to fall -GI recommendations:  Colonoscopy today  Leukocytosis Resolved.  Essential hypertension Patient takes amlodipine and metoprolol as an outpatient.  Normotensive. -Continue to hold amlodipine and metoprolol.  ?Acute kidney injury Patient's creatinine was as high as 1.24.  Unknown baseline.  Still within normal limits on admission.  Does not meet criteria for acute kidney injury.   DVT prophylaxis: SCDs Code Status: Full code Family Communication: None at bedside Disposition Plan: Discharge pending GI and cardiac workup.   Consultants:   Cardiology  Gastroenterology  Procedures:    EGD (11/31/2018)  Findings:      The Z-line was regular and was found 40 cm from the incisors.      A widely patent Schatzki ring (acquired) was found at the       gastroesophageal junction.      A few non-bleeding dispersed erosions were found in the gastric body.       Biopsies were taken with a cold forceps for Helicobacter pylori testing.      The cardia and gastric fundus were normal on retroflexion.      The duodenal bulb, first portion of the duodenum and second portion of       the duodenum were normal.  Impression:               - Z-line regular, 40 cm from the incisors.                           - Widely patent Schatzki ring.                           -  Gastric erosions without bleeding. Biopsied.                           - Normal duodenal bulb, first portion of the                            duodenum and second portion of the duodenum.  Recommendation:           - Return patient to hospital ward for ongoing care.                           - Clear liquid diet.                           - Continue present medications.                           - Await pathology results.                           - Perform a colonoscopy tomorrow.     Antimicrobials:  None    Subjective: No hematochezia or melena. No chest pain, palpitations or dyspnea.  Objective: Vitals:   01/05/17 2116 01/05/17 2300  01/06/17 0000 01/06/17 0300  BP: 114/80 (!) 91/51  113/70  Pulse: (!) 105 66 66 85  Resp:  15  17  Temp:    98.2 F (36.8 C)  TempSrc:    Oral  SpO2: 100% 94% 95% 99%  Weight:    72.3 kg (159 lb 6.4 oz)  Height:        Intake/Output Summary (Last 24 hours) at 01/06/2017 0735 Last data filed at 01/06/2017 0500 Gross per 24 hour  Intake 1654.67 ml  Output 800 ml  Net 854.67 ml   Filed Weights   01/03/17 0315 01/05/17 0628 01/06/17 0300  Weight: 73.6 kg (162 lb 4.8 oz) 72.7 kg (160 lb 3.2 oz) 72.3 kg (159 lb 6.4 oz)    Examination:  General exam: Appears calm and comfortable Respiratory system: Clear to auscultation. Respiratory effort normal. Cardiovascular system: S1 & S2 heard, RRR. No murmurs, rubs, gallops or clicks. Gastrointestinal system: Abdomen is nondistended, soft and nontender. No organomegaly or masses felt. Normal bowel sounds heard. Central nervous system: Alert and oriented. No focal neurological deficits. Extremities: No edema. No calf tenderness Skin: No cyanosis. No rashes Psychiatry: Judgement and insight appear normal. Mood & affect appropriate.     Data Reviewed: I have personally reviewed following labs and imaging studies  CBC: Recent Labs  Lab 01/04/17 0059 01/04/17 0621 01/04/17 1129 01/04/17 1819 01/05/17 0206 01/06/17 0355  WBC 10.7* 11.1* 10.6* 11.6* 9.9 7.8  NEUTROABS 7.6  --   --   --  7.1 5.3  HGB 8.3* 8.2* 8.3* 8.1* 8.0* 7.9*  HCT 24.4* 23.9* 23.7* 23.3* 22.6* 22.7*  MCV 97.2 98.0 99.2 97.5 99.1 100.0  PLT 126* 120* 121* 129* 120* 133*   Basic Metabolic Panel: Recent Labs  Lab 01/02/17 1132 01/02/17 1558 01/03/17 0258 01/04/17 0059 01/05/17 0206 01/06/17 0355  NA  --  138 138 139 135 137  K  --  4.1 4.1 3.8 3.8 3.5  CL  --  113* 113* 115* 108 110  CO2  --  21* 19* 22 22  22  GLUCOSE  --  106* 110* 97 96 99  BUN  --  75* 70* 47* 21* 14  CREATININE  --  1.11 1.17 1.05 1.06 1.20  CALCIUM  --  8.3* 8.2* 7.5* 8.0* 8.2*    MG 1.5*  --   --  1.9 1.8  --    GFR: Estimated Creatinine Clearance: 54.8 mL/min (by C-G formula based on SCr of 1.2 mg/dL). Liver Function Tests: Recent Labs  Lab 01/04/17 0059  AST 31  ALT 17  ALKPHOS 25*  BILITOT 1.4*  PROT 5.1*  ALBUMIN 2.7*   No results for input(s): LIPASE, AMYLASE in the last 168 hours. No results for input(s): AMMONIA in the last 168 hours. Coagulation Profile: Recent Labs  Lab 01/02/17 0823 01/03/17 1030  INR 1.08 1.27   Cardiac Enzymes: Recent Labs  Lab 01/02/17 2240 01/03/17 0258 01/03/17 1147 01/04/17 0058 01/04/17 0621  TROPONINI 0.52* 0.38* 0.13* 2.07* 2.42*   BNP (last 3 results) No results for input(s): PROBNP in the last 8760 hours. HbA1C: No results for input(s): HGBA1C in the last 72 hours. CBG: No results for input(s): GLUCAP in the last 168 hours. Lipid Profile: Recent Labs    01/06/17 0355  CHOL 73  HDL 23*  LDLCALC 41  TRIG 45  CHOLHDL 3.2   Thyroid Function Tests: No results for input(s): TSH, T4TOTAL, FREET4, T3FREE, THYROIDAB in the last 72 hours. Anemia Panel: No results for input(s): VITAMINB12, FOLATE, FERRITIN, TIBC, IRON, RETICCTPCT in the last 72 hours. Sepsis Labs: No results for input(s): PROCALCITON, LATICACIDVEN in the last 168 hours.  Recent Results (from the past 240 hour(s))  Culture, blood (Routine X 2) w Reflex to ID Panel     Status: None (Preliminary result)   Collection Time: 01/02/17 12:35 PM  Result Value Ref Range Status   Specimen Description BLOOD RIGHT ARM  Final   Special Requests   Final    BOTTLES DRAWN AEROBIC AND ANAEROBIC Blood Culture results may not be optimal due to an excessive volume of blood received in culture bottles   Culture NO GROWTH 3 DAYS  Final   Report Status PENDING  Incomplete  Culture, blood (Routine X 2) w Reflex to ID Panel     Status: None (Preliminary result)   Collection Time: 01/02/17 12:50 PM  Result Value Ref Range Status   Specimen Description  BLOOD LEFT ARM  Final   Special Requests   Final    BOTTLES DRAWN AEROBIC AND ANAEROBIC Blood Culture results may not be optimal due to an excessive volume of blood received in culture bottles   Culture NO GROWTH 3 DAYS  Final   Report Status PENDING  Incomplete  MRSA PCR Screening     Status: None   Collection Time: 01/03/17  1:13 PM  Result Value Ref Range Status   MRSA by PCR NEGATIVE NEGATIVE Final    Comment:        The GeneXpert MRSA Assay (FDA approved for NASAL specimens only), is one component of a comprehensive MRSA colonization surveillance program. It is not intended to diagnose MRSA infection nor to guide or monitor treatment for MRSA infections.          Radiology Studies: No results found.      Scheduled Meds: . amiodarone  400 mg Oral BID  . atorvastatin  80 mg Oral Daily  . escitalopram  10 mg Oral Daily  . pantoprazole (PROTONIX) IV  40 mg Intravenous Q12H  . senna  1 tablet Oral BID  . sodium chloride flush  3 mL Intravenous Q12H   Continuous Infusions: . sodium chloride Stopped (01/05/17 1222)  . sodium chloride    . pantoprozole (PROTONIX) infusion 8 mg/hr (01/06/17 0151)     LOS: 4 days     Jacquelin Hawking, MD [facility] 01/06/2017, 7:35 AM Pager: [phone]  If 7PM-7AM, please contact night-coverage [email or web address] Password [facility] 01/06/2017, 7:35 AM

## 2017-01-06 NOTE — Plan of Care (Signed)
Patient prepping for colonoscopy tonight. Instructed to let RN know if stool output is not clear by the end of his bowel prep.

## 2017-01-07 ENCOUNTER — Encounter (HOSPITAL_COMMUNITY): Payer: Self-pay | Admitting: Gastroenterology

## 2017-01-07 DIAGNOSIS — I2510 Atherosclerotic heart disease of native coronary artery without angina pectoris: Secondary | ICD-10-CM

## 2017-01-07 LAB — CULTURE, BLOOD (ROUTINE X 2)
CULTURE: NO GROWTH
Culture: NO GROWTH

## 2017-01-07 LAB — URINALYSIS, ROUTINE W REFLEX MICROSCOPIC
BILIRUBIN URINE: NEGATIVE
Glucose, UA: NEGATIVE mg/dL
Hgb urine dipstick: NEGATIVE
Ketones, ur: NEGATIVE mg/dL
Leukocytes, UA: NEGATIVE
NITRITE: NEGATIVE
PROTEIN: NEGATIVE mg/dL
SPECIFIC GRAVITY, URINE: 1 — AB (ref 1.005–1.030)
pH: 6 (ref 5.0–8.0)

## 2017-01-07 LAB — CBC WITH DIFFERENTIAL/PLATELET
BASOS PCT: 0 %
Basophils Absolute: 0 10*3/uL (ref 0.0–0.1)
EOS ABS: 0.3 10*3/uL (ref 0.0–0.7)
Eosinophils Relative: 3 %
HCT: 23.7 % — ABNORMAL LOW (ref 39.0–52.0)
HEMOGLOBIN: 8.2 g/dL — AB (ref 13.0–17.0)
Lymphocytes Relative: 17 %
Lymphs Abs: 1.7 10*3/uL (ref 0.7–4.0)
MCH: 34.3 pg — ABNORMAL HIGH (ref 26.0–34.0)
MCHC: 34.6 g/dL (ref 30.0–36.0)
MCV: 99.2 fL (ref 78.0–100.0)
Monocytes Absolute: 0.8 10*3/uL (ref 0.1–1.0)
Monocytes Relative: 8 %
NEUTROS PCT: 72 %
Neutro Abs: 7.5 10*3/uL (ref 1.7–7.7)
Platelets: 183 10*3/uL (ref 150–400)
RBC: 2.39 MIL/uL — AB (ref 4.22–5.81)
RDW: 17.1 % — ABNORMAL HIGH (ref 11.5–15.5)
WBC: 10.3 10*3/uL (ref 4.0–10.5)

## 2017-01-07 LAB — BASIC METABOLIC PANEL
Anion gap: 6 (ref 5–15)
BUN: 13 mg/dL (ref 6–20)
CALCIUM: 8.5 mg/dL — AB (ref 8.9–10.3)
CO2: 24 mmol/L (ref 22–32)
CREATININE: 1.22 mg/dL (ref 0.61–1.24)
Chloride: 107 mmol/L (ref 101–111)
GFR, EST NON AFRICAN AMERICAN: 57 mL/min — AB (ref >60)
Glucose, Bld: 100 mg/dL — ABNORMAL HIGH (ref 65–99)
Potassium: 3.4 mmol/L — ABNORMAL LOW (ref 3.5–5.1)
Sodium: 137 mmol/L (ref 135–145)

## 2017-01-07 LAB — TROPONIN I: TROPONIN I: 2.84 ng/mL — AB (ref <0.03)

## 2017-01-07 MED ORDER — SODIUM CHLORIDE 0.9 % IV SOLN: 250.0000 mL | INTRAVENOUS | Status: DC | PRN

## 2017-01-07 MED ORDER — SODIUM CHLORIDE 0.9 % WEIGHT BASED INFUSION
3.0000 mL/kg/h | INTRAVENOUS | Status: DC
  Administered 2017-01-08: 3 mL/kg/h via INTRAVENOUS

## 2017-01-07 MED ORDER — ASPIRIN 81 MG PO CHEW
81.0000 mg | CHEWABLE_TABLET | ORAL | Status: AC
  Administered 2017-01-08: 81 mg via ORAL
  Filled 2017-01-07: qty 1

## 2017-01-07 MED ORDER — SODIUM CHLORIDE 0.9 % WEIGHT BASED INFUSION: 1.0000 mL/kg/h | INTRAVENOUS | Status: DC

## 2017-01-07 MED ORDER — SODIUM CHLORIDE 0.9% FLUSH: 3.0000 mL | INTRAVENOUS | Status: DC | PRN

## 2017-01-07 MED ORDER — SODIUM CHLORIDE 0.9% FLUSH
3.0000 mL | Freq: Two times a day (BID) | INTRAVENOUS | Status: DC
  Administered 2017-01-08: 3 mL via INTRAVENOUS

## 2017-01-07 NOTE — Progress Notes (Signed)
Urinary catheter was removed.  Pt has been voiding fine.  Hinton DyerYoko Coley Kulikowski, RN

## 2017-01-07 NOTE — Progress Notes (Signed)
Progress Note  Patient Name: Stephen Crawford Date of Encounter: 01/07/2017  Primary Cardiologist: Dr. Turner Danielsowan   Subjective   No complaints today. He denies any recurrent syncope/ near syncope. No CP or dyspnea.   Inpatient Medications    Scheduled Meds: . amiodarone  400 mg Oral BID  . atorvastatin  80 mg Oral Daily  . escitalopram  10 mg Oral Daily  . lidocaine  1 application Urethral Once  . pantoprazole  40 mg Oral BID  . senna  1 tablet Oral BID  . sodium chloride flush  3 mL Intravenous Q12H   Continuous Infusions: . sodium chloride Stopped (01/05/17 1222)  . sodium chloride     PRN Meds: sodium chloride, acetaminophen **OR** acetaminophen, albuterol, alum & mag hydroxide-simeth, nitroGLYCERIN, ondansetron **OR** ondansetron (ZOFRAN) IV, ondansetron (ZOFRAN) IV, polyethylene glycol, sodium chloride flush   Vital Signs    Vitals:   01/06/17 2152 01/07/17 0040 01/07/17 0530 01/07/17 0817  BP: 134/66 120/65 129/67 127/74  Pulse:  88 87   Resp:  17 16   Temp:  98.7 F (37.1 C) 98.6 F (37 C) 98 F (36.7 C)  TempSrc:  Oral Oral Oral  SpO2:  100% 98%   Weight:   160 lb 12.8 oz (72.9 kg)   Height:        Intake/Output Summary (Last 24 hours) at 01/07/2017 0934 Last data filed at 01/07/2017 0700 Gross per 24 hour  Intake 1363 ml  Output 2250 ml  Net -887 ml   Filed Weights   01/06/17 0300 01/06/17 0915 01/07/17 0530  Weight: 159 lb 6.4 oz (72.3 kg) 159 lb 6.4 oz (72.3 kg) 160 lb 12.8 oz (72.9 kg)    Telemetry    NSR - Personally Reviewed  ECG    NSR - Personally Reviewed  Physical Exam   GEN: No acute distress. Bruise under right eye 2/2 fall Neck: No JVD Cardiac: RRR, no murmurs, rubs, or gallops.  Respiratory: Clear to auscultation bilaterally. GI: Soft, nontender, non-distended  MS: No edema; No deformity. Neuro:  Nonfocal  Psych: Normal affect   Labs    Chemistry Recent Labs  Lab 01/04/17 0059 01/05/17 0206 01/06/17 0355  01/07/17 0815  NA 139 135 137 137  K 3.8 3.8 3.5 3.4*  CL 115* 108 110 107  CO2 22 22 22 24   GLUCOSE 97 96 99 100*  BUN 47* 21* 14 13  CREATININE 1.05 1.06 1.20 1.22  CALCIUM 7.5* 8.0* 8.2* 8.5*  PROT 5.1*  --   --   --   ALBUMIN 2.7*  --   --   --   AST 31  --   --   --   ALT 17  --   --   --   ALKPHOS 25*  --   --   --   BILITOT 1.4*  --   --   --   GFRNONAA >60 >60 58* 57*  GFRAA >60 >60 >60 >60  ANIONGAP 2* 5 5 6      Hematology Recent Labs  Lab 01/05/17 0206 01/06/17 0355 01/07/17 0815  WBC 9.9 7.8 10.3  RBC 2.28* 2.27* 2.39*  HGB 8.0* 7.9* 8.2*  HCT 22.6* 22.7* 23.7*  MCV 99.1 100.0 99.2  MCH 35.1* 34.8* 34.3*  MCHC 35.4 34.8 34.6  RDW 18.6* 17.7* 17.1*  PLT 120* 133* 183    Cardiac Enzymes Recent Labs  Lab 01/03/17 0258 01/03/17 1147 01/04/17 0058 01/04/17 0621  TROPONINI 0.38* 0.13* 2.07* 2.42*  Recent Labs  Lab 01/02/17 0826  TROPIPOC 0.05     BNPNo results for input(s): BNP, PROBNP in the last 168 hours.   DDimer No results for input(s): DDIMER in the last 168 hours.   Radiology    No results found.  Cardiac Studies   None   Patient Profile     71 y.o. male with CAD s/p DESx2 to LAD and PCI to Cx 2015, HTN, HLD, former tobacco abuse admitted with 2 syncopal episodes, found to have new onset atrial fib, bifascicular block, and GI bleeding with ABL anemia c/b severe orthostasis and troponin elevation to 2.4.   Assessment & Plan    1. Syncope: ? Etiology. Likely secondary to orthostatic hypotension in the setting of hypovolemia. However cannot r/o arrhthymias as the cause. May consider outpatient monitor. All antihypertensives currently on hold. Correct anemia. May need addition of midodrine if continued orthostatic hypotension. He denies any recurrent syncope/ near syncope since admission.   2. PAF: newly recognized. Maintaining NSR w/ amiodarone. CHA2DS2 VASc score is 3. He is anemic, however GI w/u completed yesterday and no signs  of active bleeding. Per GI, ok to start a/c. Consider Eliquis. Will defer to MD.   4. NSTEMI: prior h/o CAD as outlined above. Troponin this admit peaked at 2.42. ? Demand ischemia from GIB/ ABL anemia. May need repeat cath to assess coronaries. He denies CP.   5.  Anemia: Hgb at 8.2 today, up  From 7.9 yesterday. EGD with gastric erosions but no bleeding. Colonoscopy 11/13 showed multiple sigmoid diverticulosis, internal hemorrhoids but no evidence of active bleeding. Per GI, "Okay to start anticoagulation/antiplatelet from GI standpoint" (see brief OP note). Fe supplementation and PPI therapy per primary.   7. Hypokalemia: K 3.4. Give supplemental K-dur.   8. CAD: s/p DESx2 to LAD and PCI to Cx 2015. No CP but elevated troponin. May need cath.    For questions or updates, please contact [facility] Please consult [email or web address] for contact info under Cardiology/STEMI.      Signed, Robbie LisBrittainy Abreanna Drawdy, PA-C  01/07/2017, 9:34 AM

## 2017-01-07 NOTE — Progress Notes (Signed)
PROGRESS NOTE    Stephen Crawford  ZOX:[id number] DOB: 1945-11-01 DOA: 01/02/2017 PCP: System, Pcp Not In   Brief Narrative: Stephen Crawford is a 71 y.o. male with a history of CAD, MI status post stents times 07/12/2013.  He presented after having 2 syncopal episodes at home associated facial trauma.  He is found to have A. fib and RVR.  He was started on Cardizem drip and switched to amiodarone.  He was also started on Eliquis but subsequently suffered GI bleeding.  Eliquis was discontinued and GI consulted for evaluation of upper versus lower GI bleed.  He required 1 units of blood transfusions to date.  EGD was performed on 11/13 and was significant for nonbleeding gastric erosions.  Colonoscopy is planned for 11/14.  Cardiology plans on performing a heart cath week prior to discharge.  Patient has no recurrent GI bleeding.  Hemoglobin is slightly down but appears stable.   Assessment & Plan:   Principal Problem:   New onset a-fib with RVR Active Problems:   Syncope and collapse   CAD/stents   HTN (hypertension)   Non-ST elevation (NSTEMI) myocardial infarction (HCC)   Acute blood loss anemia   Atrial fibrillation with RVR Patient with a CHA2DS2-VASc Score of 3.  Patient was initially started on Eliquis but discontinued secondary to GI bleeding. Rate is controlled on amiodarone. -Cardiology recommendations: cardiac cath planned for 11/16; hold anticoagulation -Continue amiodarone  Syncope Concerned this could be cardiogenic.  It seems that this may have been secondary to atrial fibrillation.  Patient with soft blood pressures in the setting of taking antihypertensives, which may also contributed.  Cardiology consulted as above.  Acute GI bleeding Acute blood loss anemia Hemoglobin dropped from about 13 to 9.3 over a 24 hour period and has been as low as 7.6, requiring 1 unit of PRBC on January 03, 2017. No bleeding source identified on EGD or colonoscopy. Hemoglobin stable today. GI  recommending he can restart anticoagulation/antiplatelet if appropriate -defer to Cardiology for anticoagulation/antiplatelet therapy  Leukocytosis Resolved.  Essential hypertension Patient takes amlodipine and metoprolol as an outpatient.  Normotensive. -Continue to hold amlodipine and metoprolol.  ?Acute kidney injury Patient's creatinine was as high as 1.24.  Unknown baseline.  Still within normal limits on admission.  Does not meet criteria for acute kidney injury.  NSTEMI No persistent chest pain. Troponin peaked at 2.42 with no downtrend -repeat troponin -cardiology recommendations: LHC planned for 11/16   DVT prophylaxis: SCDs Code Status: Full code Family Communication: None at bedside Disposition Plan: Discharge pending GI and cardiac workup.   Consultants:   Cardiology  Gastroenterology  Procedures:    EGD (11/31/2018)  Findings:      The Z-line was regular and was found 40 cm from the incisors.      A widely patent Schatzki ring (acquired) was found at the       gastroesophageal junction.      A few non-bleeding dispersed erosions were found in the gastric body.       Biopsies were taken with a cold forceps for Helicobacter pylori testing.      The cardia and gastric fundus were normal on retroflexion.      The duodenal bulb, first portion of the duodenum and second portion of       the duodenum were normal.  Impression:               - Z-line regular, 40 cm from the incisors.                           -  Widely patent Schatzki ring.                           - Gastric erosions without bleeding. Biopsied.                           - Normal duodenal bulb, first portion of the                            duodenum and second portion of the duodenum.  Recommendation:           - Return patient to hospital ward for ongoing care.                           - Clear liquid diet.                           - Continue present medications.                            - Await pathology results.                           - Perform a colonoscopy tomorrow.   Colonoscopy (01/08/2017)  Findings/recommendations ------------------------------------ - Colonoscopy today showed fair prep, multiple sigmoid diverticulosis, internal hemorrhoids but no evidence of active bleeding. - Okay to start anticoagulation/antiplatelet from GI standpoint. - Recommend repeat colonoscopy as previously  recommended by his primary gastroenterologist  because of fair prep today.   Antimicrobials:  None    Subjective: No melena or hematochezia. No chest pain or dyspnea.  Objective: Vitals:   01/06/17 2152 01/07/17 0040 01/07/17 0530 01/07/17 0817  BP: 134/66 120/65 129/67 127/74  Pulse:  88 87   Resp:  17 16   Temp:  98.7 F (37.1 C) 98.6 F (37 C) 98 F (36.7 C)  TempSrc:  Oral Oral Oral  SpO2:  100% 98%   Weight:   72.9 kg (160 lb 12.8 oz)   Height:        Intake/Output Summary (Last 24 hours) at 01/07/2017 1140 Last data filed at 01/07/2017 0845 Gross per 24 hour  Intake 983 ml  Output 2250 ml  Net -1267 ml   Filed Weights   01/06/17 0300 01/06/17 0915 01/07/17 0530  Weight: 72.3 kg (159 lb 6.4 oz) 72.3 kg (159 lb 6.4 oz) 72.9 kg (160 lb 12.8 oz)    Examination:  General exam: Appears calm and comfortable Respiratory system: Clear to auscultation. Respiratory effort normal. Cardiovascular system: S1 & S2 heard, RRR. No murmurs, rubs, gallops or clicks. Gastrointestinal system: Abdomen is nondistended, soft and nontender. No organomegaly or masses felt. Normal bowel sounds heard. Central nervous system: Alert and oriented. No focal neurological deficits. Extremities: No edema. No calf tenderness Skin: No cyanosis. No rashes Psychiatry: Judgement and insight appear normal. Mood & affect appropriate.   Exam unchanged from 11/14    Data Reviewed: I have personally reviewed following labs and imaging studies  CBC: Recent Labs  Lab 01/04/17 0059   01/04/17 1129 01/04/17 1819 01/05/17 0206 01/06/17 0355 01/07/17 0815  WBC 10.7*   < > 10.6* 11.6* 9.9 7.8 10.3  NEUTROABS 7.6  --   --   --  7.1  5.3 7.5  HGB 8.3*   < > 8.3* 8.1* 8.0* 7.9* 8.2*  HCT 24.4*   < > 23.7* 23.3* 22.6* 22.7* 23.7*  MCV 97.2   < > 99.2 97.5 99.1 100.0 99.2  PLT 126*   < > 121* 129* 120* 133* 183   < > = values in this interval not displayed.   Basic Metabolic Panel: Recent Labs  Lab 01/02/17 1132  01/03/17 0258 01/04/17 0059 01/05/17 0206 01/06/17 0355 01/07/17 0815  NA  --    < > 138 139 135 137 137  K  --    < > 4.1 3.8 3.8 3.5 3.4*  CL  --    < > 113* 115* 108 110 107  CO2  --    < > 19* 22 22 22 24   GLUCOSE  --    < > 110* 97 96 99 100*  BUN  --    < > 70* 47* 21* 14 13  CREATININE  --    < > 1.17 1.05 1.06 1.20 1.22  CALCIUM  --    < > 8.2* 7.5* 8.0* 8.2* 8.5*  MG 1.5*  --   --  1.9 1.8  --   --    < > = values in this interval not displayed.   GFR: Estimated Creatinine Clearance: 53.9 mL/min (by C-G formula based on SCr of 1.22 mg/dL). Liver Function Tests: Recent Labs  Lab 01/04/17 0059  AST 31  ALT 17  ALKPHOS 25*  BILITOT 1.4*  PROT 5.1*  ALBUMIN 2.7*   No results for input(s): LIPASE, AMYLASE in the last 168 hours. No results for input(s): AMMONIA in the last 168 hours. Coagulation Profile: Recent Labs  Lab 01/02/17 0823 01/03/17 1030  INR 1.08 1.27   Cardiac Enzymes: Recent Labs  Lab 01/02/17 2240 01/03/17 0258 01/03/17 1147 01/04/17 0058 01/04/17 0621  TROPONINI 0.52* 0.38* 0.13* 2.07* 2.42*   BNP (last 3 results) No results for input(s): PROBNP in the last 8760 hours. HbA1C: No results for input(s): HGBA1C in the last 72 hours. CBG: No results for input(s): GLUCAP in the last 168 hours. Lipid Profile: Recent Labs    01/06/17 0355  CHOL 73  HDL 23*  LDLCALC 41  TRIG 45  CHOLHDL 3.2   Thyroid Function Tests: No results for input(s): TSH, T4TOTAL, FREET4, T3FREE, THYROIDAB in the last 72  hours. Anemia Panel: No results for input(s): VITAMINB12, FOLATE, FERRITIN, TIBC, IRON, RETICCTPCT in the last 72 hours. Sepsis Labs: No results for input(s): PROCALCITON, LATICACIDVEN in the last 168 hours.  Recent Results (from the past 240 hour(s))  Culture, blood (Routine X 2) w Reflex to ID Panel     Status: None (Preliminary result)   Collection Time: 01/02/17 12:35 PM  Result Value Ref Range Status   Specimen Description BLOOD RIGHT ARM  Final   Special Requests   Final    BOTTLES DRAWN AEROBIC AND ANAEROBIC Blood Culture results may not be optimal due to an excessive volume of blood received in culture bottles   Culture NO GROWTH 4 DAYS  Final   Report Status PENDING  Incomplete  Culture, blood (Routine X 2) w Reflex to ID Panel     Status: None (Preliminary result)   Collection Time: 01/02/17 12:50 PM  Result Value Ref Range Status   Specimen Description BLOOD LEFT ARM  Final   Special Requests   Final    BOTTLES DRAWN AEROBIC AND ANAEROBIC Blood Culture results may not  be optimal due to an excessive volume of blood received in culture bottles   Culture NO GROWTH 4 DAYS  Final   Report Status PENDING  Incomplete  MRSA PCR Screening     Status: None   Collection Time: 01/03/17  1:13 PM  Result Value Ref Range Status   MRSA by PCR NEGATIVE NEGATIVE Final    Comment:        The GeneXpert MRSA Assay (FDA approved for NASAL specimens only), is one component of a comprehensive MRSA colonization surveillance program. It is not intended to diagnose MRSA infection nor to guide or monitor treatment for MRSA infections.          Radiology Studies: No results found.      Scheduled Meds: . amiodarone  400 mg Oral BID  . atorvastatin  80 mg Oral Daily  . escitalopram  10 mg Oral Daily  . lidocaine  1 application Urethral Once  . pantoprazole  40 mg Oral BID  . senna  1 tablet Oral BID  . sodium chloride flush  3 mL Intravenous Q12H   Continuous Infusions: .  sodium chloride Stopped (01/05/17 1222)  . sodium chloride       LOS: 5 days     Jacquelin Hawking, MD [facility] 01/07/2017, 11:40 AM Pager: [phone]  If 7PM-7AM, please contact night-coverage [email or web address] Password [email or web address] 01/07/2017, 11:40 AM

## 2017-01-07 NOTE — Care Management Important Message (Signed)
Important Message  Patient Details  Name: Stephen Crawford MRN: [id number] Date of Birth: [birth year]   Medicare Important Message Given:  Yes    Murna Backer Stefan ChurchBratton 01/07/2017, 10:23 AM

## 2017-01-07 NOTE — Plan of Care (Signed)
Patient remains pain-free tonight 

## 2017-01-08 ENCOUNTER — Encounter (HOSPITAL_COMMUNITY): Payer: Self-pay | Admitting: Interventional Cardiology

## 2017-01-08 ENCOUNTER — Inpatient Hospital Stay (HOSPITAL_COMMUNITY): Payer: Medicare HMO

## 2017-01-08 ENCOUNTER — Encounter (HOSPITAL_COMMUNITY)
Admission: EM | Disposition: A | Discharge status: Home / Self Care | Payer: Self-pay | Source: Home / Self Care | Attending: Family Medicine

## 2017-01-08 HISTORY — PX: LEFT HEART CATH AND CORONARY ANGIOGRAPHY: CATH118249

## 2017-01-08 LAB — PULMONARY FUNCTION TEST
FEF 25-75 POST: 4.38 L/s
FEF 25-75 PRE: 3.21 L/s
FEF2575-%CHANGE-POST: 36 %
FEF2575-%PRED-POST: 198 %
FEF2575-%PRED-PRE: 145 %
FEV1-%Change-Post: 6 %
FEV1-%Pred-Post: 126 %
FEV1-%Pred-Pre: 118 %
FEV1-POST: 3.79 L
FEV1-Pre: 3.54 L
FEV1FVC-%CHANGE-POST: 3 %
FEV1FVC-%PRED-PRE: 105 %
FEV6-%CHANGE-POST: 3 %
FEV6-%PRED-POST: 120 %
FEV6-%Pred-Pre: 116 %
FEV6-Post: 4.69 L
FEV6-Pre: 4.54 L
FEV6FVC-%CHANGE-POST: 0 %
FEV6FVC-%Pred-Post: 105 %
FEV6FVC-%Pred-Pre: 105 %
FVC-%CHANGE-POST: 3 %
FVC-%Pred-Post: 114 %
FVC-%Pred-Pre: 110 %
FVC-Post: 4.73 L
FVC-Pre: 4.58 L
POST FEV1/FVC RATIO: 80 %
Post FEV6/FVC ratio: 99 %
Pre FEV1/FVC ratio: 77 %
Pre FEV6/FVC Ratio: 99 %

## 2017-01-08 LAB — CBC
HEMATOCRIT: 23.8 % — AB (ref 39.0–52.0)
HEMOGLOBIN: 8.2 g/dL — AB (ref 13.0–17.0)
MCH: 34.3 pg — ABNORMAL HIGH (ref 26.0–34.0)
MCHC: 34.5 g/dL (ref 30.0–36.0)
MCV: 99.6 fL (ref 78.0–100.0)
Platelets: 189 10*3/uL (ref 150–400)
RBC: 2.39 MIL/uL — ABNORMAL LOW (ref 4.22–5.81)
RDW: 17.4 % — ABNORMAL HIGH (ref 11.5–15.5)
WBC: 8.4 10*3/uL (ref 4.0–10.5)

## 2017-01-08 LAB — PROTIME-INR
INR: 1.12
Prothrombin Time: 14.3 seconds (ref 11.4–15.2)

## 2017-01-08 LAB — BASIC METABOLIC PANEL
Anion gap: 6 (ref 5–15)
BUN: 16 mg/dL (ref 6–20)
CHLORIDE: 106 mmol/L (ref 101–111)
CO2: 25 mmol/L (ref 22–32)
CREATININE: 1.26 mg/dL — AB (ref 0.61–1.24)
Calcium: 8.3 mg/dL — ABNORMAL LOW (ref 8.9–10.3)
GFR calc Af Amer: >60 mL/min (ref >60)
GFR calc non Af Amer: 55 mL/min — ABNORMAL LOW (ref >60)
GLUCOSE: 93 mg/dL (ref 65–99)
POTASSIUM: 3.3 mmol/L — AB (ref 3.5–5.1)
Sodium: 137 mmol/L (ref 135–145)

## 2017-01-08 SURGERY — LEFT HEART CATH AND CORONARY ANGIOGRAPHY
Anesthesia: LOCAL

## 2017-01-08 MED ORDER — IOPAMIDOL (ISOVUE-370) INJECTION 76%
INTRAVENOUS | Status: AC
  Filled 2017-01-08: qty 100

## 2017-01-08 MED ORDER — HEPARIN (PORCINE) IN NACL 100-0.45 UNIT/ML-% IJ SOLN: 1000.0000 [IU]/h | INTRAMUSCULAR | Status: DC

## 2017-01-08 MED ORDER — VERAPAMIL HCL 2.5 MG/ML IV SOLN
INTRAVENOUS | Status: DC | PRN
  Administered 2017-01-08: 10 mL via INTRA_ARTERIAL

## 2017-01-08 MED ORDER — ALBUTEROL SULFATE (2.5 MG/3ML) 0.083% IN NEBU
2.5000 mg | INHALATION_SOLUTION | Freq: Once | RESPIRATORY_TRACT | Status: AC
  Administered 2017-01-08: 2.5 mg via RESPIRATORY_TRACT

## 2017-01-08 MED ORDER — FENTANYL CITRATE (PF) 100 MCG/2ML IJ SOLN
INTRAMUSCULAR | Status: DC | PRN
  Administered 2017-01-08: 25 ug via INTRAVENOUS

## 2017-01-08 MED ORDER — HEPARIN SODIUM (PORCINE) 1000 UNIT/ML IJ SOLN
INTRAMUSCULAR | Status: AC
  Filled 2017-01-08: qty 1

## 2017-01-08 MED ORDER — HEPARIN (PORCINE) IN NACL 2-0.9 UNIT/ML-% IJ SOLN
INTRAMUSCULAR | Status: AC
  Filled 2017-01-08: qty 1000

## 2017-01-08 MED ORDER — POTASSIUM CHLORIDE CRYS ER 20 MEQ PO TBCR
40.0000 meq | EXTENDED_RELEASE_TABLET | Freq: Once | ORAL | Status: AC
  Administered 2017-01-08: 40 meq via ORAL
  Filled 2017-01-08: qty 2

## 2017-01-08 MED ORDER — ASPIRIN 81 MG PO CHEW
81.0000 mg | CHEWABLE_TABLET | Freq: Every day | ORAL | Status: DC
  Administered 2017-01-08 – 2017-01-10 (×3): 81 mg via ORAL
  Filled 2017-01-08 (×2): qty 1

## 2017-01-08 MED ORDER — SODIUM CHLORIDE 0.9 % IV SOLN: INTRAVENOUS | Status: AC

## 2017-01-08 MED ORDER — HEPARIN (PORCINE) IN NACL 100-0.45 UNIT/ML-% IJ SOLN
1200.0000 [IU]/h | INTRAMUSCULAR | Status: DC
  Administered 2017-01-08: 1000 [IU]/h via INTRAVENOUS
  Administered 2017-01-09: 1050 [IU]/h via INTRAVENOUS
  Administered 2017-01-10: 1200 [IU]/h via INTRAVENOUS
  Filled 2017-01-08 (×3): qty 250

## 2017-01-08 MED ORDER — HEPARIN (PORCINE) IN NACL 2-0.9 UNIT/ML-% IJ SOLN
INTRAMUSCULAR | Status: AC | PRN
  Administered 2017-01-08: 1000 mL

## 2017-01-08 MED ORDER — MIDAZOLAM HCL 2 MG/2ML IJ SOLN
INTRAMUSCULAR | Status: DC | PRN
  Administered 2017-01-08: 2 mg via INTRAVENOUS

## 2017-01-08 MED ORDER — ACETAMINOPHEN 325 MG PO TABS: 650.0000 mg | ORAL_TABLET | ORAL | Status: DC | PRN

## 2017-01-08 MED ORDER — IOPAMIDOL (ISOVUE-370) INJECTION 76%
INTRAVENOUS | Status: AC
  Filled 2017-01-08: qty 50

## 2017-01-08 MED ORDER — ONDANSETRON HCL 4 MG/2ML IJ SOLN: 4.0000 mg | Freq: Four times a day (QID) | INTRAMUSCULAR | Status: DC | PRN

## 2017-01-08 MED ORDER — VERAPAMIL HCL 2.5 MG/ML IV SOLN
INTRAVENOUS | Status: AC
  Filled 2017-01-08: qty 2

## 2017-01-08 MED ORDER — SODIUM CHLORIDE 0.9 % IV SOLN: 250.0000 mL | INTRAVENOUS | Status: DC | PRN

## 2017-01-08 MED ORDER — ASPIRIN 81 MG PO CHEW
CHEWABLE_TABLET | ORAL | Status: AC
  Filled 2017-01-08: qty 1

## 2017-01-08 MED ORDER — SODIUM CHLORIDE 0.9% FLUSH
3.0000 mL | Freq: Two times a day (BID) | INTRAVENOUS | Status: DC
  Administered 2017-01-09 – 2017-01-10 (×2): 3 mL via INTRAVENOUS

## 2017-01-08 MED ORDER — IOPAMIDOL (ISOVUE-370) INJECTION 76%
INTRAVENOUS | Status: DC | PRN
  Administered 2017-01-08: 155 mL via INTRAVENOUS

## 2017-01-08 MED ORDER — LIDOCAINE HCL (PF) 1 % IJ SOLN
INTRAMUSCULAR | Status: AC
  Filled 2017-01-08: qty 30

## 2017-01-08 MED ORDER — MIDAZOLAM HCL 2 MG/2ML IJ SOLN
INTRAMUSCULAR | Status: AC
  Filled 2017-01-08: qty 2

## 2017-01-08 MED ORDER — FENTANYL CITRATE (PF) 100 MCG/2ML IJ SOLN
INTRAMUSCULAR | Status: AC
  Filled 2017-01-08: qty 2

## 2017-01-08 MED ORDER — LIDOCAINE HCL (PF) 1 % IJ SOLN
INTRAMUSCULAR | Status: DC | PRN
  Administered 2017-01-08: 2 mL

## 2017-01-08 MED ORDER — HEPARIN SODIUM (PORCINE) 1000 UNIT/ML IJ SOLN
INTRAMUSCULAR | Status: DC | PRN
  Administered 2017-01-08: 3500 [IU] via INTRAVENOUS

## 2017-01-08 MED ORDER — SODIUM CHLORIDE 0.9% FLUSH: 3.0000 mL | INTRAVENOUS | Status: DC | PRN

## 2017-01-08 SURGICAL SUPPLY — 12 items
CATH INFINITI 5 FR 3DRC (CATHETERS) ×2 IMPLANT
CATH INFINITI 5 FR JL3.5 (CATHETERS) ×2 IMPLANT
CATH INFINITI 5FR AL1 (CATHETERS) ×2 IMPLANT
CATH INFINITI JR4 5F (CATHETERS) ×2 IMPLANT
DEVICE RAD COMP TR BAND LRG (VASCULAR PRODUCTS) ×2 IMPLANT
GLIDESHEATH SLEND SS 6F .021 (SHEATH) ×2 IMPLANT
GUIDEWIRE INQWIRE 1.5J.035X260 (WIRE) ×1 IMPLANT
INQWIRE 1.5J .035X260CM (WIRE) ×2
KIT HEART LEFT (KITS) ×2 IMPLANT
PACK CARDIAC CATHETERIZATION (CUSTOM PROCEDURE TRAY) ×2 IMPLANT
TRANSDUCER W/STOPCOCK (MISCELLANEOUS) ×2 IMPLANT
TUBING CIL FLEX 10 FLL-RA (TUBING) ×2 IMPLANT

## 2017-01-08 NOTE — H&P (View-Only) (Signed)
DAILY PROGRESS NOTE   Patient Name: Stephen Crawford Date of Encounter: 01/08/2017  Chief Complaint   No chest pain  Patient Profile   71 y.o. male with history of CAD admitted with new onset afib and syncope, now with progressive anemia concerning for GI bleed and chest pain with elevated troponin consistent with NSTEMI.  Subjective   No further complaints. Mild hypokalemia today- replete. Hemoglobin has been stable for several days. Plan for LHC today at 1500.  Objective   Vitals:   01/07/17 2055 01/08/17 0031 01/08/17 0300 01/08/17 0814  BP: (!) 115/59 107/66 130/66 110/65  Pulse: 86 69 79 69  Resp: 18     Temp: 98.6 F (37 C) 98.1 F (36.7 C) 98.3 F (36.8 C) 99.2 F (37.3 C)  TempSrc: Oral Oral Oral Oral  SpO2: 98% 100% 97% 99%  Weight:   160 lb 6.4 oz (72.8 kg)   Height:        Intake/Output Summary (Last 24 hours) at 01/08/2017 0925 Last data filed at 01/08/2017 0544 Gross per 24 hour  Intake 756.45 ml  Output 2750 ml  Net -1993.55 ml   Filed Weights   01/06/17 0915 01/07/17 0530 01/08/17 0300  Weight: 159 lb 6.4 oz (72.3 kg) 160 lb 12.8 oz (72.9 kg) 160 lb 6.4 oz (72.8 kg)    Physical Exam   General appearance: alert and no distress Neck: no carotid bruit, no JVD and thyroid not enlarged, symmetric, no tenderness/mass/nodules Lungs: clear to auscultation bilaterally Heart: regular rate and rhythm, S1, S2 normal, no murmur, click, rub or gallop Abdomen: soft, non-tender; bowel sounds normal; no masses,  no organomegaly Extremities: extremities normal, atraumatic, no cyanosis or edema Pulses: 2+ and symmetric Skin: Skin color, texture, turgor normal. No rashes or lesions Neurologic: Grossly normal Psych: Pleasant  Inpatient Medications    Scheduled Meds: . amiodarone  400 mg Oral BID  . atorvastatin  80 mg Oral Daily  . escitalopram  10 mg Oral Daily  . lidocaine  1 application Urethral Once  . pantoprazole  40 mg Oral BID  . senna  1 tablet  Oral BID  . sodium chloride flush  3 mL Intravenous Q12H  . sodium chloride flush  3 mL Intravenous Q12H    Continuous Infusions: . sodium chloride Stopped (01/05/17 1222)  . sodium chloride    . sodium chloride    . sodium chloride 1 mL/kg/hr (01/08/17 0646)    PRN Meds: sodium chloride, sodium chloride, acetaminophen **OR** acetaminophen, albuterol, alum & mag hydroxide-simeth, nitroGLYCERIN, ondansetron **OR** ondansetron (ZOFRAN) IV, ondansetron (ZOFRAN) IV, polyethylene glycol, sodium chloride flush, sodium chloride flush   Labs   Results for orders placed or performed during the hospital encounter of 01/02/17 (from the past 48 hour(s))  Urinalysis, Routine w reflex microscopic     Status: Abnormal   Collection Time: 01/07/17  7:17 AM  Result Value Ref Range   Color, Urine COLORLESS (A) YELLOW   APPearance CLEAR CLEAR   Specific Gravity, Urine 1.000 (L) 1.005 - 1.030   pH 6.0 5.0 - 8.0   Glucose, UA NEGATIVE NEGATIVE mg/dL   Hgb urine dipstick NEGATIVE NEGATIVE   Bilirubin Urine NEGATIVE NEGATIVE   Ketones, ur NEGATIVE NEGATIVE mg/dL   Protein, ur NEGATIVE NEGATIVE mg/dL   Nitrite NEGATIVE NEGATIVE   Leukocytes, UA NEGATIVE NEGATIVE  Basic metabolic panel     Status: Abnormal   Collection Time: 01/07/17  8:15 AM  Result Value Ref Range   Sodium 137  135 - 145 mmol/L   Potassium 3.4 (L) 3.5 - 5.1 mmol/L   Chloride 107 101 - 111 mmol/L   CO2 24 22 - 32 mmol/L   Glucose, Bld 100 (H) 65 - 99 mg/dL   BUN 13 6 - 20 mg/dL   Creatinine, Ser 1.22 0.61 - 1.24 mg/dL   Calcium 8.5 (L) 8.9 - 10.3 mg/dL   GFR calc non Af Amer 57 (L) >60 mL/min   GFR calc Af Amer >60 >60 mL/min    Comment: (NOTE) The eGFR has been calculated using the CKD EPI equation. This calculation has not been validated in all clinical situations. eGFR's persistently <60 mL/min signify possible Chronic Kidney Disease.    Anion gap 6 5 - 15  CBC with Differential/Platelet     Status: Abnormal    Collection Time: 01/07/17  8:15 AM  Result Value Ref Range   WBC 10.3 4.0 - 10.5 K/uL   RBC 2.39 (L) 4.22 - 5.81 MIL/uL   Hemoglobin 8.2 (L) 13.0 - 17.0 g/dL   HCT 23.7 (L) 39.0 - 52.0 %   MCV 99.2 78.0 - 100.0 fL   MCH 34.3 (H) 26.0 - 34.0 pg   MCHC 34.6 30.0 - 36.0 g/dL   RDW 17.1 (H) 11.5 - 15.5 %   Platelets 183 150 - 400 K/uL   Neutrophils Relative % 72 %   Neutro Abs 7.5 1.7 - 7.7 K/uL   Lymphocytes Relative 17 %   Lymphs Abs 1.7 0.7 - 4.0 K/uL   Monocytes Relative 8 %   Monocytes Absolute 0.8 0.1 - 1.0 K/uL   Eosinophils Relative 3 %   Eosinophils Absolute 0.3 0.0 - 0.7 K/uL   Basophils Relative 0 %   Basophils Absolute 0.0 0.0 - 0.1 K/uL  Troponin I     Status: Abnormal   Collection Time: 01/07/17 12:47 PM  Result Value Ref Range   Troponin I 2.84 (HH) <0.03 ng/mL    Comment: CRITICAL VALUE NOTED.  VALUE IS CONSISTENT WITH PREVIOUSLY REPORTED AND CALLED VALUE.  Basic metabolic panel     Status: Abnormal   Collection Time: 01/08/17  5:39 AM  Result Value Ref Range   Sodium 137 135 - 145 mmol/L   Potassium 3.3 (L) 3.5 - 5.1 mmol/L   Chloride 106 101 - 111 mmol/L   CO2 25 22 - 32 mmol/L   Glucose, Bld 93 65 - 99 mg/dL   BUN 16 6 - 20 mg/dL   Creatinine, Ser 1.26 (H) 0.61 - 1.24 mg/dL   Calcium 8.3 (L) 8.9 - 10.3 mg/dL   GFR calc non Af Amer 55 (L) >60 mL/min   GFR calc Af Amer >60 >60 mL/min    Comment: (NOTE) The eGFR has been calculated using the CKD EPI equation. This calculation has not been validated in all clinical situations. eGFR's persistently <60 mL/min signify possible Chronic Kidney Disease.    Anion gap 6 5 - 15  Protime-INR     Status: None   Collection Time: 01/08/17  5:39 AM  Result Value Ref Range   Prothrombin Time 14.3 11.4 - 15.2 seconds   INR 1.12   CBC     Status: Abnormal   Collection Time: 01/08/17  5:39 AM  Result Value Ref Range   WBC 8.4 4.0 - 10.5 K/uL   RBC 2.39 (L) 4.22 - 5.81 MIL/uL   Hemoglobin 8.2 (L) 13.0 - 17.0 g/dL    HCT 23.8 (L) 39.0 - 52.0 %  MCV 99.6 78.0 - 100.0 fL   MCH 34.3 (H) 26.0 - 34.0 pg   MCHC 34.5 30.0 - 36.0 g/dL   RDW 17.4 (H) 11.5 - 15.5 %   Platelets 189 150 - 400 K/uL    ECG   N/A  Telemetry   Sinus rhythm - Personally Reviewed  Radiology    No results found.  Cardiac Studies   LV EF: 65% -   70%  ------------------------------------------------------------------- Indications:      Atrial fibrillation - 427.31.  ------------------------------------------------------------------- History:   PMH:   Syncope.  Atrial fibrillation.  Coronary artery disease.  Risk factors:  Hypertension.  ------------------------------------------------------------------- Study Conclusions  - Left ventricle: The cavity size was normal. Wall thickness was   increased in a pattern of mild LVH. Systolic function was   vigorous. The estimated ejection fraction was in the range of 65%   to 70%. Wall motion was normal; there were no regional wall   motion abnormalities. Doppler parameters are consistent with   abnormal left ventricular relaxation (grade 1 diastolic   dysfunction). - Aortic valve: Mildly calcified annulus. A bicuspid morphology   cannot be excluded; mildly calcified leaflets. - Right atrium: Central venous pressure (est): 3 mm Hg. - Tricuspid valve: There was trivial regurgitation. - Pulmonary arteries: PA peak pressure: 29 mm Hg (S). - Pericardium, extracardiac: There was no pericardial effusion.  Impressions:  - Mild LVH with LVEF 65-70% and grade 1 diastolic dysfunction.   Mildly calcified aortic annulus, cannot exclude bicuspid   morphology with mildly calcified leaflets. Trivial tricuspid   regurgitation with estimated PASP 29 mmHg.  Assessment   Principal Problem:   New onset a-fib with RVR Active Problems:   Syncope and collapse   CAD/stents   HTN (hypertension)   Non-ST elevation (NSTEMI) myocardial infarction ([place])   Acute blood loss  anemia   Plan   1. Feels great today - normal BM's, H/H stable. Need to replete K+. On IVF's for cath this afternoon. Dispo based on that.  Time Spent Directly with Patient:  I have spent a total of 15 minutes with the patient reviewing hospital notes, telemetry, EKGs, labs and examining the patient as well as establishing an assessment and plan that was discussed personally with the patient. > 50% of time was spent in direct patient care.  Length of Stay:  LOS: 6 days   Pixie Casino, MD, Stephen Crawford  Attending Cardiologist  Direct Dial: [phone]  Fax: [phone]  Website:  [email or web address] 01/08/2017, 9:25 AM

## 2017-01-08 NOTE — Progress Notes (Signed)
Removed 3 mL of air from TR band at 1300.  Started bleeding. Air pushed back in. Removed of air at 1405 and no bleeding noted this time.

## 2017-01-08 NOTE — Progress Notes (Signed)
Pt came back from cath lab.  No bruising or bleeding noted on r radial site. Denies pain.  Hinton DyerYoko Tammy Wickliffe, RN

## 2017-01-08 NOTE — Progress Notes (Signed)
PROGRESS NOTE    Stephen PeruWilliam Karger  ZOX:[id number] DOB: 1945-03-10 DOA: 01/02/2017 PCP: System, Pcp Not In   Brief Narrative: Stephen Crawford is a 71 y.o. male with a history of CAD, MI status post stents times 07/12/2013.  He presented after having 2 syncopal episodes at home associated facial trauma.  He is found to have A. fib and RVR.  He was started on Cardizem drip and switched to amiodarone.  He was also started on Eliquis but subsequently suffered GI bleeding.  Eliquis was discontinued and GI consulted for evaluation of upper versus lower GI bleed.  He required 1 units of blood transfusions to date.  EGD was performed on 11/13 and was significant for nonbleeding gastric erosions.  Colonoscopy is planned for 11/14.  Cardiology plans on performing a heart cath week prior to discharge.  Patient has no recurrent GI bleeding.  Hemoglobin is slightly down but appears stable.   Assessment & Plan:   Principal Problem:   New onset a-fib with RVR Active Problems:   Syncope and collapse   CAD/stents   HTN (hypertension)   Non-ST elevation (NSTEMI) myocardial infarction (HCC)   Acute blood loss anemia   Atrial fibrillation with RVR Patient with a CHA2DS2-VASc Score of 3.  Patient was initially started on Eliquis but discontinued secondary to GI bleeding. Rate is controlled on amiodarone. -Cardiology recommendations: cardiac cath planned for today; hold anticoagulation -Continue amiodarone  Syncope Concerned this could be cardiogenic.  It seems that this may have been secondary to atrial fibrillation.  Patient with soft blood pressures in the setting of taking antihypertensives, which may also contributed.  Cardiology consulted as above.  Acute GI bleeding Acute blood loss anemia Hemoglobin dropped from about 13 to 9.3 over a 24 hour period and has been as low as 7.6, requiring 1 unit of PRBC on January 03, 2017. No bleeding source identified on EGD or colonoscopy. Hemoglobin stable today. GI  recommending he can restart anticoagulation/antiplatelet if appropriate -defer to Cardiology for anticoagulation/antiplatelet therapy  Leukocytosis Resolved.  Essential hypertension Patient takes amlodipine and metoprolol as an outpatient.  Normotensive. -Continue to hold amlodipine and metoprolol.  NSTEMI No persistent chest pain. Troponin peaked at 2.42 with no downtrend. Continues to trend up slightly. -cardiology recommendations: LHC planned for today  Hypokalemia -Kdur 40 Meq x1   DVT prophylaxis: SCDs Code Status: Full code Family Communication: None at bedside Disposition Plan: Discharge pending GI and cardiac workup.   Consultants:   Cardiology  Gastroenterology  Procedures:    EGD (11/31/2018)  Findings:      The Z-line was regular and was found 40 cm from the incisors.      A widely patent Schatzki ring (acquired) was found at the       gastroesophageal junction.      A few non-bleeding dispersed erosions were found in the gastric body.       Biopsies were taken with a cold forceps for Helicobacter pylori testing.      The cardia and gastric fundus were normal on retroflexion.      The duodenal bulb, first portion of the duodenum and second portion of       the duodenum were normal.  Impression:               - Z-line regular, 40 cm from the incisors.                           -  Widely patent Schatzki ring.                           - Gastric erosions without bleeding. Biopsied.                           - Normal duodenal bulb, first portion of the                            duodenum and second portion of the duodenum.  Recommendation:           - Return patient to hospital ward for ongoing care.                           - Clear liquid diet.                           - Continue present medications.                           - Await pathology results.                           - Perform a colonoscopy tomorrow.   Colonoscopy  (01/08/2017)  Findings/recommendations ------------------------------------ - Colonoscopy today showed fair prep, multiple sigmoid diverticulosis, internal hemorrhoids but no evidence of active bleeding. - Okay to start anticoagulation/antiplatelet from GI standpoint. - Recommend repeat colonoscopy as previously  recommended by his primary gastroenterologist  because of fair prep today.   Antimicrobials:  None    Subjective: No melena/hematochezia. No chest pain, dyspnea or palpitations. Awaiting heart cath.  Objective: Vitals:   01/07/17 2055 01/08/17 0031 01/08/17 0300 01/08/17 0814  BP: (!) 115/59 107/66 130/66 110/65  Pulse: 86 69 79 69  Resp: 18     Temp: 98.6 F (37 C) 98.1 F (36.7 C) 98.3 F (36.8 C) 99.2 F (37.3 C)  TempSrc: Oral Oral Oral Oral  SpO2: 98% 100% 97% 99%  Weight:   72.8 kg (160 lb 6.4 oz)   Height:        Intake/Output Summary (Last 24 hours) at 01/08/2017 1035 Last data filed at 01/08/2017 0544 Gross per 24 hour  Intake 756.45 ml  Output 2750 ml  Net -1993.55 ml   Filed Weights   01/06/17 0915 01/07/17 0530 01/08/17 0300  Weight: 72.3 kg (159 lb 6.4 oz) 72.9 kg (160 lb 12.8 oz) 72.8 kg (160 lb 6.4 oz)    Examination:  General exam: Appears calm and comfortable Respiratory system: Clear to auscultation. Respiratory effort normal. Cardiovascular system: S1 & S2 heard, RRR. No murmurs, rubs, gallops or clicks. Gastrointestinal system: Abdomen is nondistended, soft and nontender. No organomegaly or masses felt. Normal bowel sounds heard. Central nervous system: Alert and oriented. No focal neurological deficits. Extremities: No edema. No calf tenderness Skin: No cyanosis. No rashes Psychiatry: Judgement and insight appear normal. Mood & affect appropriate.   Exam unchanged from 11/14    Data Reviewed: I have personally reviewed following labs and imaging studies  CBC: Recent Labs  Lab 01/04/17 0059  01/04/17 1819 01/05/17 0206  01/06/17 0355 01/07/17 0815 01/08/17 0539  WBC 10.7*   < > 11.6* 9.9 7.8 10.3 8.4  NEUTROABS 7.6  --   --  7.1 5.3 7.5  --   HGB 8.3*   < > 8.1* 8.0* 7.9* 8.2* 8.2*  HCT 24.4*   < > 23.3* 22.6* 22.7* 23.7* 23.8*  MCV 97.2   < > 97.5 99.1 100.0 99.2 99.6  PLT 126*   < > 129* 120* 133* 183 189   < > = values in this interval not displayed.   Basic Metabolic Panel: Recent Labs  Lab 01/02/17 1132  01/04/17 0059 01/05/17 0206 01/06/17 0355 01/07/17 0815 01/08/17 0539  NA  --    < > 139 135 137 137 137  K  --    < > 3.8 3.8 3.5 3.4* 3.3*  CL  --    < > 115* 108 110 107 106  CO2  --    < > 22 22 22 24 25   GLUCOSE  --    < > 97 96 99 100* 93  BUN  --    < > 47* 21* 14 13 16   CREATININE  --    < > 1.05 1.06 1.20 1.22 1.26*  CALCIUM  --    < > 7.5* 8.0* 8.2* 8.5* 8.3*  MG 1.5*  --  1.9 1.8  --   --   --    < > = values in this interval not displayed.   GFR: Estimated Creatinine Clearance: 52.2 mL/min (A) (by C-G formula based on SCr of 1.26 mg/dL (H)). Liver Function Tests: Recent Labs  Lab 01/04/17 0059  AST 31  ALT 17  ALKPHOS 25*  BILITOT 1.4*  PROT 5.1*  ALBUMIN 2.7*   No results for input(s): LIPASE, AMYLASE in the last 168 hours. No results for input(s): AMMONIA in the last 168 hours. Coagulation Profile: Recent Labs  Lab 01/02/17 0823 01/03/17 1030 01/08/17 0539  INR 1.08 1.27 1.12   Cardiac Enzymes: Recent Labs  Lab 01/03/17 0258 01/03/17 1147 01/04/17 0058 01/04/17 0621 01/07/17 1247  TROPONINI 0.38* 0.13* 2.07* 2.42* 2.84*   BNP (last 3 results) No results for input(s): PROBNP in the last 8760 hours. HbA1C: No results for input(s): HGBA1C in the last 72 hours. CBG: No results for input(s): GLUCAP in the last 168 hours. Lipid Profile: Recent Labs    01/06/17 0355  CHOL 73  HDL 23*  LDLCALC 41  TRIG 45  CHOLHDL 3.2   Thyroid Function Tests: No results for input(s): TSH, T4TOTAL, FREET4, T3FREE, THYROIDAB in the last 72 hours. Anemia  Panel: No results for input(s): VITAMINB12, FOLATE, FERRITIN, TIBC, IRON, RETICCTPCT in the last 72 hours. Sepsis Labs: No results for input(s): PROCALCITON, LATICACIDVEN in the last 168 hours.  Recent Results (from the past 240 hour(s))  Culture, blood (Routine X 2) w Reflex to ID Panel     Status: None   Collection Time: 01/02/17 12:35 PM  Result Value Ref Range Status   Specimen Description BLOOD RIGHT ARM  Final   Special Requests   Final    BOTTLES DRAWN AEROBIC AND ANAEROBIC Blood Culture results may not be optimal due to an excessive volume of blood received in culture bottles   Culture NO GROWTH 5 DAYS  Final   Report Status 01/07/2017 FINAL  Final  Culture, blood (Routine X 2) w Reflex to ID Panel     Status: None   Collection Time: 01/02/17 12:50 PM  Result Value Ref Range Status   Specimen Description BLOOD LEFT ARM  Final   Special Requests   Final    BOTTLES DRAWN AEROBIC AND  ANAEROBIC Blood Culture results may not be optimal due to an excessive volume of blood received in culture bottles   Culture NO GROWTH 5 DAYS  Final   Report Status 01/07/2017 FINAL  Final  MRSA PCR Screening     Status: None   Collection Time: 01/03/17  1:13 PM  Result Value Ref Range Status   MRSA by PCR NEGATIVE NEGATIVE Final    Comment:        The GeneXpert MRSA Assay (FDA approved for NASAL specimens only), is one component of a comprehensive MRSA colonization surveillance program. It is not intended to diagnose MRSA infection nor to guide or monitor treatment for MRSA infections.          Radiology Studies: No results found.      Scheduled Meds: . amiodarone  400 mg Oral BID  . atorvastatin  80 mg Oral Daily  . escitalopram  10 mg Oral Daily  . lidocaine  1 application Urethral Once  . pantoprazole  40 mg Oral BID  . senna  1 tablet Oral BID  . sodium chloride flush  3 mL Intravenous Q12H  . sodium chloride flush  3 mL Intravenous Q12H   Continuous Infusions: .  sodium chloride Stopped (01/05/17 1222)  . sodium chloride    . sodium chloride    . sodium chloride 1 mL/kg/hr (01/08/17 0646)     LOS: 6 days     Jacquelin Hawking, MD [facility] 01/08/2017, 10:35 AM Pager: [phone]  If 7PM-7AM, please contact night-coverage [email or web address] Password [email or web address] 01/08/2017, 10:35 AM

## 2017-01-08 NOTE — Progress Notes (Signed)
   Contacted by Pharmacy regarding restarting anticoagulation following the patient's catheterization. He was noted to have a 90% Ostial LAD and 90% Ost Ramus lesion with CT Surgery consult recommended in the setting of his recent GI bleed and concern for being on antiplatelet and anticoagulation therapy. GI has consulted on the patient and EGD nor Colonoscopy identified a source of bleeding and agreed with restarting anticoagulation if appropriate. Will restart Heparin for now and monitor for evidence of bleeding. Hold off on PO anticoagulation until a decision is made about PCI vs. surgery.   Signed, Ellsworth LennoxBrittany M Jailen Coward, PA-C 01/08/2017, 2:10 PM Pager: [phone]

## 2017-01-08 NOTE — Progress Notes (Signed)
ANTICOAGULATION CONSULT NOTE - Initial Consult  Pharmacy Consult for heparin Indication: chest pain/ACS and atrial fibrillation  No Known Allergies  Patient Measurements: Height: 5\' 9"  (175.3 cm) Weight: 160 lb 6.4 oz (72.8 kg) IBW/kg (Calculated) : 70.7 Heparin Dosing Weight: 72.3 kg  Vital Signs: Temp: 99.2 F (37.3 C) (11/16 0814) Temp Source: Oral (11/16 0814) BP: 116/62 (11/16 1149) Pulse Rate: 88 (11/16 1154)  Labs: Recent Labs    01/06/17 0355 01/07/17 0815 01/07/17 1247 01/08/17 0539  HGB 7.9* 8.2*  --  8.2*  HCT 22.7* 23.7*  --  23.8*  PLT 133* 183  --  189  LABPROT  --   --   --  14.3  INR  --   --   --  1.12  CREATININE 1.20 1.22  --  1.26*  TROPONINI  --   --  2.84*  --     Estimated Creatinine Clearance: 52.2 mL/min (A) (by C-G formula based on SCr of 1.26 mg/dL (H)).   Medical History: Past Medical History:  Diagnosis Date  . CAD (coronary artery disease) 01/02/2017    Medications:  Scheduled:  . amiodarone  400 mg Oral BID  . aspirin  81 mg Oral Daily  . atorvastatin  80 mg Oral Daily  . escitalopram  10 mg Oral Daily  . lidocaine  1 application Urethral Once  . pantoprazole  40 mg Oral BID  . senna  1 tablet Oral BID  . sodium chloride flush  3 mL Intravenous Q12H  . sodium chloride flush  3 mL Intravenous Q12H    Assessment: 71 yom who was started on apixaban prior to admission. Last documented dose was on 11/10. Was worked up subsequently for GI bleed due to decrease in hemoglobin. Underwent colonoscopy and EGD showing multiple sigmoid diverticulosis and internal hemorrhoids with no evidence of active bleeding. Recommendation was okay to start anticoagulation or antiplatelet from GI standpoint when deemed appropriate.  Cath was completed today showing 90% in Ostial LAD. Hgb has been stable since transfusion on 11/11. Platelets continue to trend upward (189). No signs/symptoms of bleeding. Will initiate heparin infusion per conversation  with cardiology until patient is evaluated by [facility] for possible surgery. Patient is currently only on aspirin for antiplatelet. Will restart infusion 8 hours after sheath was removed (11/16 at 1149).  Goal of Therapy:  Heparin level 0.3-0.7 units/ml Monitor platelets by anticoagulation protocol: Yes   Plan:  No bolus given recent GI bleed Start heparin infusion at 1000 units/hr at 2000  Check anti-Xa level in 8 hours and daily while on heparin Continue to monitor H&H and platelets  Girard CooterKimberly Perkins, PharmD Clinical Pharmacist  Pager: [phone] Clinical Phone for 01/08/2017 until 3:30pm: [phone] If after 3:30pm, please call main pharmacy at [phone] 01/08/2017,2:10 PM

## 2017-01-08 NOTE — Plan of Care (Signed)
Pt denies any current CP. All VSS. Plan for cath today. Will continue to monitor.

## 2017-01-08 NOTE — Interval H&P Note (Signed)
Cath Lab Visit (complete for each Cath Lab visit)  Clinical Evaluation Leading to the Procedure:   ACS: Yes.    Non-ACS:    Anginal Classification: CCS IV  Anti-ischemic medical therapy: Minimal Therapy (1 class of medications)  Non-Invasive Test Results: No non-invasive testing performed  Prior CABG: No previous CABG      History and Physical Interval Note:  01/08/2017 11:11 AM  Stephen Crawford  has presented today for surgery, with the diagnosis of n stemi  The various methods of treatment have been discussed with the patient and family. After consideration of risks, benefits and other options for treatment, the patient has consented to  Procedure(s): LEFT HEART CATH AND CORONARY ANGIOGRAPHY (N/A) as a surgical intervention .  The patient's history has been reviewed, patient examined, no change in status, stable for surgery.  I have reviewed the patient's chart and labs.  Questions were answered to the patient's satisfaction.     Lance MussJayadeep Kaly Mcquary

## 2017-01-08 NOTE — Progress Notes (Signed)
 DAILY PROGRESS NOTE   Patient Name: Stephen Crawford Date of Encounter: 01/08/2017  Chief Complaint   No chest pain  Patient Profile   71 y.o. male with history of CAD admitted with new onset afib and syncope, now with progressive anemia concerning for GI bleed and chest pain with elevated troponin consistent with NSTEMI.  Subjective   No further complaints. Mild hypokalemia today- replete. Hemoglobin has been stable for several days. Plan for LHC today at 1500.  Objective   Vitals:   01/07/17 2055 01/08/17 0031 01/08/17 0300 01/08/17 0814  BP: (!) 115/59 107/66 130/66 110/65  Pulse: 86 69 79 69  Resp: 18     Temp: 98.6 F (37 C) 98.1 F (36.7 C) 98.3 F (36.8 C) 99.2 F (37.3 C)  TempSrc: Oral Oral Oral Oral  SpO2: 98% 100% 97% 99%  Weight:   160 lb 6.4 oz (72.8 kg)   Height:        Intake/Output Summary (Last 24 hours) at 01/08/2017 0925 Last data filed at 01/08/2017 0544 Gross per 24 hour  Intake 756.45 ml  Output 2750 ml  Net -1993.55 ml   Filed Weights   01/06/17 0915 01/07/17 0530 01/08/17 0300  Weight: 159 lb 6.4 oz (72.3 kg) 160 lb 12.8 oz (72.9 kg) 160 lb 6.4 oz (72.8 kg)    Physical Exam   General appearance: alert and no distress Neck: no carotid bruit, no JVD and thyroid not enlarged, symmetric, no tenderness/mass/nodules Lungs: clear to auscultation bilaterally Heart: regular rate and rhythm, S1, S2 normal, no murmur, click, rub or gallop Abdomen: soft, non-tender; bowel sounds normal; no masses,  no organomegaly Extremities: extremities normal, atraumatic, no cyanosis or edema Pulses: 2+ and symmetric Skin: Skin color, texture, turgor normal. No rashes or lesions Neurologic: Grossly normal Psych: Pleasant  Inpatient Medications    Scheduled Meds: . amiodarone  400 mg Oral BID  . atorvastatin  80 mg Oral Daily  . escitalopram  10 mg Oral Daily  . lidocaine  1 application Urethral Once  . pantoprazole  40 mg Oral BID  . senna  1 tablet  Oral BID  . sodium chloride flush  3 mL Intravenous Q12H  . sodium chloride flush  3 mL Intravenous Q12H    Continuous Infusions: . sodium chloride Stopped (01/05/17 1222)  . sodium chloride    . sodium chloride    . sodium chloride 1 mL/kg/hr (01/08/17 0646)    PRN Meds: sodium chloride, sodium chloride, acetaminophen **OR** acetaminophen, albuterol, alum & mag hydroxide-simeth, nitroGLYCERIN, ondansetron **OR** ondansetron (ZOFRAN) IV, ondansetron (ZOFRAN) IV, polyethylene glycol, sodium chloride flush, sodium chloride flush   Labs   Results for orders placed or performed during the hospital encounter of 01/02/17 (from the past 48 hour(s))  Urinalysis, Routine w reflex microscopic     Status: Abnormal   Collection Time: 01/07/17  7:17 AM  Result Value Ref Range   Color, Urine COLORLESS (A) YELLOW   APPearance CLEAR CLEAR   Specific Gravity, Urine 1.000 (L) 1.005 - 1.030   pH 6.0 5.0 - 8.0   Glucose, UA NEGATIVE NEGATIVE mg/dL   Hgb urine dipstick NEGATIVE NEGATIVE   Bilirubin Urine NEGATIVE NEGATIVE   Ketones, ur NEGATIVE NEGATIVE mg/dL   Protein, ur NEGATIVE NEGATIVE mg/dL   Nitrite NEGATIVE NEGATIVE   Leukocytes, UA NEGATIVE NEGATIVE  Basic metabolic panel     Status: Abnormal   Collection Time: 01/07/17  8:15 AM  Result Value Ref Range   Sodium 137   135 - 145 mmol/L   Potassium 3.4 (L) 3.5 - 5.1 mmol/L   Chloride 107 101 - 111 mmol/L   CO2 24 22 - 32 mmol/L   Glucose, Bld 100 (H) 65 - 99 mg/dL   BUN 13 6 - 20 mg/dL   Creatinine, Ser 1.22 0.61 - 1.24 mg/dL   Calcium 8.5 (L) 8.9 - 10.3 mg/dL   GFR calc non Af Amer 57 (L) >60 mL/min   GFR calc Af Amer >60 >60 mL/min    Comment: (NOTE) The eGFR has been calculated using the CKD EPI equation. This calculation has not been validated in all clinical situations. eGFR's persistently <60 mL/min signify possible Chronic Kidney Disease.    Anion gap 6 5 - 15  CBC with Differential/Platelet     Status: Abnormal    Collection Time: 01/07/17  8:15 AM  Result Value Ref Range   WBC 10.3 4.0 - 10.5 K/uL   RBC 2.39 (L) 4.22 - 5.81 MIL/uL   Hemoglobin 8.2 (L) 13.0 - 17.0 g/dL   HCT 23.7 (L) 39.0 - 52.0 %   MCV 99.2 78.0 - 100.0 fL   MCH 34.3 (H) 26.0 - 34.0 pg   MCHC 34.6 30.0 - 36.0 g/dL   RDW 17.1 (H) 11.5 - 15.5 %   Platelets 183 150 - 400 K/uL   Neutrophils Relative % 72 %   Neutro Abs 7.5 1.7 - 7.7 K/uL   Lymphocytes Relative 17 %   Lymphs Abs 1.7 0.7 - 4.0 K/uL   Monocytes Relative 8 %   Monocytes Absolute 0.8 0.1 - 1.0 K/uL   Eosinophils Relative 3 %   Eosinophils Absolute 0.3 0.0 - 0.7 K/uL   Basophils Relative 0 %   Basophils Absolute 0.0 0.0 - 0.1 K/uL  Troponin I     Status: Abnormal   Collection Time: 01/07/17 12:47 PM  Result Value Ref Range   Troponin I 2.84 (HH) <0.03 ng/mL    Comment: CRITICAL VALUE NOTED.  VALUE IS CONSISTENT WITH PREVIOUSLY REPORTED AND CALLED VALUE.  Basic metabolic panel     Status: Abnormal   Collection Time: 01/08/17  5:39 AM  Result Value Ref Range   Sodium 137 135 - 145 mmol/L   Potassium 3.3 (L) 3.5 - 5.1 mmol/L   Chloride 106 101 - 111 mmol/L   CO2 25 22 - 32 mmol/L   Glucose, Bld 93 65 - 99 mg/dL   BUN 16 6 - 20 mg/dL   Creatinine, Ser 1.26 (H) 0.61 - 1.24 mg/dL   Calcium 8.3 (L) 8.9 - 10.3 mg/dL   GFR calc non Af Amer 55 (L) >60 mL/min   GFR calc Af Amer >60 >60 mL/min    Comment: (NOTE) The eGFR has been calculated using the CKD EPI equation. This calculation has not been validated in all clinical situations. eGFR's persistently <60 mL/min signify possible Chronic Kidney Disease.    Anion gap 6 5 - 15  Protime-INR     Status: None   Collection Time: 01/08/17  5:39 AM  Result Value Ref Range   Prothrombin Time 14.3 11.4 - 15.2 seconds   INR 1.12   CBC     Status: Abnormal   Collection Time: 01/08/17  5:39 AM  Result Value Ref Range   WBC 8.4 4.0 - 10.5 K/uL   RBC 2.39 (L) 4.22 - 5.81 MIL/uL   Hemoglobin 8.2 (L) 13.0 - 17.0 g/dL    HCT 23.8 (L) 39.0 - 52.0 %     MCV 99.6 78.0 - 100.0 fL   MCH 34.3 (H) 26.0 - 34.0 pg   MCHC 34.5 30.0 - 36.0 g/dL   RDW 17.4 (H) 11.5 - 15.5 %   Platelets 189 150 - 400 K/uL    ECG   N/A  Telemetry   Sinus rhythm - Personally Reviewed  Radiology    No results found.  Cardiac Studies   LV EF: 65% -   70%  ------------------------------------------------------------------- Indications:      Atrial fibrillation - 427.31.  ------------------------------------------------------------------- History:   PMH:   Syncope.  Atrial fibrillation.  Coronary artery disease.  Risk factors:  Hypertension.  ------------------------------------------------------------------- Study Conclusions  - Left ventricle: The cavity size was normal. Wall thickness was   increased in a pattern of mild LVH. Systolic function was   vigorous. The estimated ejection fraction was in the range of 65%   to 70%. Wall motion was normal; there were no regional wall   motion abnormalities. Doppler parameters are consistent with   abnormal left ventricular relaxation (grade 1 diastolic   dysfunction). - Aortic valve: Mildly calcified annulus. A bicuspid morphology   cannot be excluded; mildly calcified leaflets. - Right atrium: Central venous pressure (est): 3 mm Hg. - Tricuspid valve: There was trivial regurgitation. - Pulmonary arteries: PA peak pressure: 29 mm Hg (S). - Pericardium, extracardiac: There was no pericardial effusion.  Impressions:  - Mild LVH with LVEF 65-70% and grade 1 diastolic dysfunction.   Mildly calcified aortic annulus, cannot exclude bicuspid   morphology with mildly calcified leaflets. Trivial tricuspid   regurgitation with estimated PASP 29 mmHg.  Assessment   Principal Problem:   New onset a-fib with RVR Active Problems:   Syncope and collapse   CAD/stents   HTN (hypertension)   Non-ST elevation (NSTEMI) myocardial infarction (HCC)   Acute blood loss  anemia   Plan   1. Feels great today - normal BM's, H/H stable. Need to replete K+. On IVF's for cath this afternoon. Dispo based on that.  Time Spent Directly with Patient:  I have spent a total of 15 minutes with the patient reviewing hospital notes, telemetry, EKGs, labs and examining the patient as well as establishing an assessment and plan that was discussed personally with the patient. > 50% of time was spent in direct patient care.  Length of Stay:  LOS: 6 days   Kenneth C. Hilty, MD, FACC  [place]  [facility]  Attending Cardiologist  Direct Dial: [phone]  Fax: [phone]  Website:  [email or web address]  Kenneth C Hilty 01/08/2017, 9:25 AM  

## 2017-01-09 ENCOUNTER — Inpatient Hospital Stay (HOSPITAL_COMMUNITY): Payer: Medicare HMO

## 2017-01-09 DIAGNOSIS — I214 Non-ST elevation (NSTEMI) myocardial infarction: Secondary | ICD-10-CM

## 2017-01-09 DIAGNOSIS — I4891 Unspecified atrial fibrillation: Secondary | ICD-10-CM

## 2017-01-09 DIAGNOSIS — I480 Paroxysmal atrial fibrillation: Principal | ICD-10-CM

## 2017-01-09 DIAGNOSIS — I2510 Atherosclerotic heart disease of native coronary artery without angina pectoris: Secondary | ICD-10-CM

## 2017-01-09 DIAGNOSIS — Z01810 Encounter for preprocedural cardiovascular examination: Secondary | ICD-10-CM

## 2017-01-09 LAB — BASIC METABOLIC PANEL
Anion gap: 5 (ref 5–15)
BUN: 17 mg/dL (ref 6–20)
CALCIUM: 8.2 mg/dL — AB (ref 8.9–10.3)
CO2: 23 mmol/L (ref 22–32)
CREATININE: 1.2 mg/dL (ref 0.61–1.24)
Chloride: 107 mmol/L (ref 101–111)
GFR calc non Af Amer: 58 mL/min — ABNORMAL LOW (ref >60)
Glucose, Bld: 117 mg/dL — ABNORMAL HIGH (ref 65–99)
Potassium: 3.8 mmol/L (ref 3.5–5.1)
SODIUM: 135 mmol/L (ref 135–145)

## 2017-01-09 LAB — HEPARIN LEVEL (UNFRACTIONATED)
HEPARIN UNFRACTIONATED: 0.29 [IU]/mL — AB (ref 0.30–0.70)
HEPARIN UNFRACTIONATED: 0.25 [IU]/mL — AB (ref 0.30–0.70)

## 2017-01-09 LAB — CBC
HCT: 23.1 % — ABNORMAL LOW (ref 39.0–52.0)
Hemoglobin: 7.8 g/dL — ABNORMAL LOW (ref 13.0–17.0)
MCH: 34.1 pg — ABNORMAL HIGH (ref 26.0–34.0)
MCHC: 33.8 g/dL (ref 30.0–36.0)
MCV: 100.9 fL — ABNORMAL HIGH (ref 78.0–100.0)
Platelets: 212 10*3/uL (ref 150–400)
RBC: 2.29 MIL/uL — ABNORMAL LOW (ref 4.22–5.81)
RDW: 17.1 % — AB (ref 11.5–15.5)
WBC: 8.4 10*3/uL (ref 4.0–10.5)

## 2017-01-09 LAB — MAGNESIUM: MAGNESIUM: 1.7 mg/dL (ref 1.7–2.4)

## 2017-01-09 NOTE — Progress Notes (Signed)
CARDIAC REHAB PHASE I   Pt in bed. Pre Op ed completed with pt. Reviewed sternal precautions, practiced getting out of bed without pushing off, restrictions, IS, practiced using the IS a few times, gave hospital stay handout and Cardiac Surgery booklet, encouraged pt to watch preparing for OHS video with daughter when she comes to visit on Sunday. Pt verbalized understanding of education.   [id number]  York Ceriseyara R Siarra Gilkerson MS, ACSM CEP  12:34 PM 01/09/2017

## 2017-01-09 NOTE — Progress Notes (Signed)
ANTICOAGULATION CONSULT NOTE - Initial Consult  Pharmacy Consult for heparin Indication: chest pain/ACS and atrial fibrillation  No Known Allergies  Patient Measurements: Height: 5\' 9"  (175.3 cm) Weight: 159 lb 6.4 oz (72.3 kg) IBW/kg (Calculated) : 70.7 Heparin Dosing Weight: 72.3 kg  Vital Signs: Temp: 98.2 F (36.8 C) (11/17 0806) Temp Source: Oral (11/17 0806) BP: 115/70 (11/17 0806) Pulse Rate: 83 (11/17 0438)  Labs: Recent Labs    01/07/17 0815 01/07/17 1247 01/08/17 0539 01/09/17 0517 01/09/17 0722 01/09/17 0922  HGB 8.2*  --  8.2* 7.8*  --   --   HCT 23.7*  --  23.8* 23.1*  --   --   PLT 183  --  189 212  --   --   LABPROT  --   --  14.3  --   --   --   INR  --   --  1.12  --   --   --   HEPARINUNFRC  --   --   --   --  0.29*  --   CREATININE 1.22  --  1.26*  --   --  1.20  TROPONINI  --  2.84*  --   --   --   --     Estimated Creatinine Clearance: 54.8 mL/min (by C-G formula based on SCr of 1.2 mg/dL).   Medical History: Past Medical History:  Diagnosis Date  . CAD (coronary artery disease) 01/02/2017    Medications:  Scheduled:  . amiodarone  400 mg Oral BID  . aspirin  81 mg Oral Daily  . atorvastatin  80 mg Oral Daily  . escitalopram  10 mg Oral Daily  . lidocaine  1 application Urethral Once  . pantoprazole  40 mg Oral BID  . senna  1 tablet Oral BID  . sodium chloride flush  3 mL Intravenous Q12H  . sodium chloride flush  3 mL Intravenous Q12H    Assessment: 71 yom who was started on apixaban PTA. Last documented dose 11/10. Was worked up subsequently for GI bleed due to decrease in hemoglobin. Underwent colonoscopy and EGD showing multiple sigmoid diverticulosis and internal hemorrhoids with no evidence of active bleeding. Recommendation was okay to start anticoagulation or antiplatelet from GI standpoint when deemed appropriate.  Cath was completed yesterday showing 90% in Ostial LAD. Hgb has been stable since transfusion on 11/11.  Platelets trending up. No signs/symptoms of bleeding. Cardiology initiated heparin until patient is evaluated by [facility] for possible surgery. Heparin restarted 8 hours after sheath was removed (11/16 at 1149).Per MD patient at high risk for GIB because of antiplatelets. Surgery consulted and plan for CABG on Monday with MAZE and LAA clipping to decrease risk of future anticoagulation per MD.   Heparin level subtherapeutic today, 0.29  Goal of Therapy:  Heparin level 0.3-0.7 units/ml Monitor platelets by anticoagulation protocol: Yes   Plan:  Increase heparin to 1050 units/hr (small increase due to GIB hx) 8 hour heparin level Daily heparin level, CBC Monitor clinical course, s/sx bleeding  Donnella Biyler Ahmet Schank, PharmD PGY1 Acute Care Pharmacy Resident Pager: [phone] 01/09/2017 12:17 PM

## 2017-01-09 NOTE — Progress Notes (Signed)
DAILY PROGRESS NOTE   Patient Name: Stephen Crawford Date of Encounter: 01/09/2017  Chief Complaint   No chest pain  Patient Profile   71 y.o. male with history of CAD admitted with new onset afib and syncope, now with progressive anemia concerning for GI bleed and chest pain with elevated troponin consistent with NSTEMI.  Subjective   No events overnight. Cath yesterday shows significant ostial LAD disease - given recent GI bleeding on plavix, there is concern that he would not tolerate long-term anticoagulation. CT surgery consulted and recommends CABG, MAZE and clipping of the LAA.  Objective   Vitals:   01/08/17 2054 01/09/17 0035 01/09/17 0438 01/09/17 0806  BP: (!) 103/57 109/69 119/74 115/70  Pulse: 75 78 83   Resp: 18 18 18    Temp: 99.1 F (37.3 C) 99.2 F (37.3 C) 98.6 F (37 C) 98.2 F (36.8 C)  TempSrc: Oral Oral Oral Oral  SpO2: 99% 100% 100% 100%  Weight:   159 lb 6.4 oz (72.3 kg)   Height:        Intake/Output Summary (Last 24 hours) at 01/09/2017 1135 Last data filed at 01/09/2017 0109 Gross per 24 hour  Intake 258.33 ml  Output 1200 ml  Net -941.67 ml   Filed Weights   01/07/17 0530 01/08/17 0300 01/09/17 0438  Weight: 160 lb 12.8 oz (72.9 kg) 160 lb 6.4 oz (72.8 kg) 159 lb 6.4 oz (72.3 kg)    Physical Exam   General appearance: alert and no distress Lungs: clear to auscultation bilaterally Heart: regular rate and rhythm, S1, S2 normal, no murmur, click, rub or gallop Extremities: extremities normal, atraumatic, no cyanosis or edema Neurologic: Grossly normal  Inpatient Medications    Scheduled Meds: . amiodarone  400 mg Oral BID  . aspirin  81 mg Oral Daily  . atorvastatin  80 mg Oral Daily  . escitalopram  10 mg Oral Daily  . lidocaine  1 application Urethral Once  . pantoprazole  40 mg Oral BID  . senna  1 tablet Oral BID  . sodium chloride flush  3 mL Intravenous Q12H  . sodium chloride flush  3 mL Intravenous Q12H    Continuous  Infusions: . sodium chloride Stopped (01/05/17 1222)  . sodium chloride    . sodium chloride    . heparin 1,000 Units/hr (01/08/17 2322)    PRN Meds: sodium chloride, sodium chloride, acetaminophen **OR** acetaminophen, acetaminophen, albuterol, alum & mag hydroxide-simeth, nitroGLYCERIN, ondansetron **OR** ondansetron (ZOFRAN) IV, polyethylene glycol, sodium chloride flush, sodium chloride flush   Labs   Results for orders placed or performed during the hospital encounter of 01/02/17 (from the past 48 hour(s))  Troponin I     Status: Abnormal   Collection Time: 01/07/17 12:47 PM  Result Value Ref Range   Troponin I 2.84 (HH) <0.03 ng/mL    Comment: CRITICAL VALUE NOTED.  VALUE IS CONSISTENT WITH PREVIOUSLY REPORTED AND CALLED VALUE.  Basic metabolic panel     Status: Abnormal   Collection Time: 01/08/17  5:39 AM  Result Value Ref Range   Sodium 137 135 - 145 mmol/L   Potassium 3.3 (L) 3.5 - 5.1 mmol/L   Chloride 106 101 - 111 mmol/L   CO2 25 22 - 32 mmol/L   Glucose, Bld 93 65 - 99 mg/dL   BUN 16 6 - 20 mg/dL   Creatinine, Ser 1.26 (H) 0.61 - 1.24 mg/dL   Calcium 8.3 (L) 8.9 - 10.3 mg/dL   GFR calc  non Af Amer 55 (L) >60 mL/min   GFR calc Af Amer >60 >60 mL/min    Comment: (NOTE) The eGFR has been calculated using the CKD EPI equation. This calculation has not been validated in all clinical situations. eGFR's persistently <60 mL/min signify possible Chronic Kidney Disease.    Anion gap 6 5 - 15  Protime-INR     Status: None   Collection Time: 01/08/17  5:39 AM  Result Value Ref Range   Prothrombin Time 14.3 11.4 - 15.2 seconds   INR 1.12   CBC     Status: Abnormal   Collection Time: 01/08/17  5:39 AM  Result Value Ref Range   WBC 8.4 4.0 - 10.5 K/uL   RBC 2.39 (L) 4.22 - 5.81 MIL/uL   Hemoglobin 8.2 (L) 13.0 - 17.0 g/dL   HCT 23.8 (L) 39.0 - 52.0 %   MCV 99.6 78.0 - 100.0 fL   MCH 34.3 (H) 26.0 - 34.0 pg   MCHC 34.5 30.0 - 36.0 g/dL   RDW 17.4 (H) 11.5 - 15.5 %     Platelets 189 150 - 400 K/uL  CBC     Status: Abnormal   Collection Time: 01/09/17  5:17 AM  Result Value Ref Range   WBC 8.4 4.0 - 10.5 K/uL   RBC 2.29 (L) 4.22 - 5.81 MIL/uL   Hemoglobin 7.8 (L) 13.0 - 17.0 g/dL   HCT 23.1 (L) 39.0 - 52.0 %   MCV 100.9 (H) 78.0 - 100.0 fL   MCH 34.1 (H) 26.0 - 34.0 pg   MCHC 33.8 30.0 - 36.0 g/dL   RDW 17.1 (H) 11.5 - 15.5 %   Platelets 212 150 - 400 K/uL  Heparin level (unfractionated)     Status: Abnormal   Collection Time: 01/09/17  7:22 AM  Result Value Ref Range   Heparin Unfractionated 0.29 (L) 0.30 - 0.70 IU/mL    Comment:        IF HEPARIN RESULTS ARE BELOW EXPECTED VALUES, AND PATIENT DOSAGE HAS BEEN CONFIRMED, SUGGEST FOLLOW UP TESTING OF ANTITHROMBIN III LEVELS.   Basic metabolic panel     Status: Abnormal   Collection Time: 01/09/17  9:22 AM  Result Value Ref Range   Sodium 135 135 - 145 mmol/L   Potassium 3.8 3.5 - 5.1 mmol/L   Chloride 107 101 - 111 mmol/L   CO2 23 22 - 32 mmol/L   Glucose, Bld 117 (H) 65 - 99 mg/dL   BUN 17 6 - 20 mg/dL   Creatinine, Ser 1.20 0.61 - 1.24 mg/dL   Calcium 8.2 (L) 8.9 - 10.3 mg/dL   GFR calc non Af Amer 58 (L) >60 mL/min   GFR calc Af Amer >60 >60 mL/min    Comment: (NOTE) The eGFR has been calculated using the CKD EPI equation. This calculation has not been validated in all clinical situations. eGFR's persistently <60 mL/min signify possible Chronic Kidney Disease.    Anion gap 5 5 - 15  Magnesium     Status: None   Collection Time: 01/09/17  9:22 AM  Result Value Ref Range   Magnesium 1.7 1.7 - 2.4 mg/dL    ECG   N/A  Telemetry   Sinus rhythm - Personally Reviewed  Radiology    No results found.  Cardiac Studies   LV EF: 65% -   70%  ------------------------------------------------------------------- Indications:      Atrial fibrillation - 427.31.  ------------------------------------------------------------------- History:   PMH:   Syncope.  Atrial  fibrillation.  Coronary artery disease.  Risk factors:  Hypertension.  ------------------------------------------------------------------- Study Conclusions  - Left ventricle: The cavity size was normal. Wall thickness was   increased in a pattern of mild LVH. Systolic function was   vigorous. The estimated ejection fraction was in the range of 65%   to 70%. Wall motion was normal; there were no regional wall   motion abnormalities. Doppler parameters are consistent with   abnormal left ventricular relaxation (grade 1 diastolic   dysfunction). - Aortic valve: Mildly calcified annulus. A bicuspid morphology   cannot be excluded; mildly calcified leaflets. - Right atrium: Central venous pressure (est): 3 mm Hg. - Tricuspid valve: There was trivial regurgitation. - Pulmonary arteries: PA peak pressure: 29 mm Hg (S). - Pericardium, extracardiac: There was no pericardial effusion.  Impressions:  - Mild LVH with LVEF 65-70% and grade 1 diastolic dysfunction.   Mildly calcified aortic annulus, cannot exclude bicuspid   morphology with mildly calcified leaflets. Trivial tricuspid   regurgitation with estimated PASP 29 mmHg.  Assessment   Principal Problem:   New onset a-fib with RVR Active Problems:   Syncope and collapse   CAD/stents   HTN (hypertension)   Non-ST elevation (NSTEMI) myocardial infarction ([place])   Acute blood loss anemia   Plan   1. No further chest pain - significant ostial LAD disease- high risk lesion. High risk for recurrent GIB on antiplatelets. Appreciate CT surgery evaluation - plan for CABG on Monday with MAZE and LAA clipping to hopefully minimize risk of future anticoagulation.  Time Spent Directly with Patient:  I have spent a total of 15 minutes with the patient reviewing hospital notes, telemetry, EKGs, labs and examining the patient as well as establishing an assessment and plan that was discussed personally with the patient. > 50% of time was  spent in direct patient care.  Length of Stay:  LOS: 7 days   Pixie Casino, MD, [place]  Attending Cardiologist  Direct Dial: [phone]  Fax: [phone]  Website:  [email or web address] 01/09/2017, 11:35 AM

## 2017-01-09 NOTE — Progress Notes (Signed)
PROGRESS NOTE    [name]  ZOX:[id number] DOB: May 13, 1945 DOA: 01/02/2017 PCP: System, Pcp Not In   Brief Narrative: Stephen Crawford is a 71 y.o. male with a history of CAD, MI status post stents times 07/12/2013.  He presented after having 2 syncopal episodes at home associated facial trauma.  He is found to have A. fib and RVR.  He was started on Cardizem drip and switched to amiodarone.  He was also started on Eliquis but subsequently suffered GI bleeding.  Eliquis was discontinued and GI consulted for evaluation of upper versus lower GI bleed.  He required 1 units of blood transfusions to date.  EGD was performed on 11/13 and was significant for nonbleeding gastric erosions.  Colonoscopy is planned for 11/14.  Cardiology plans on performing a heart cath week prior to discharge.  Patient has no recurrent GI bleeding.  Hemoglobin is slightly down but appears stable.   Assessment & Plan:   Principal Problem:   New onset a-fib with RVR Active Problems:   Syncope and collapse   CAD/stents   HTN (hypertension)   Non-ST elevation (NSTEMI) myocardial infarction ([facility])   Acute blood loss anemia   Atrial fibrillation with RVR Patient with a CHA2DS2-VASc Score of 3.  Patient was initially started on Eliquis but discontinued secondary to GI bleeding. Rate is controlled on amiodarone. -Cardiology recommendations: consider surgical management of Afib -Cardiothoracic surgery recommendations -Continue amiodarone  Syncope Concerned this could be cardiogenic.  It seems that this may have been secondary to atrial fibrillation.  Patient with soft blood pressures in the setting of taking antihypertensives, which may also contributed.  Cardiology consulted as above.  Acute GI bleeding Acute blood loss anemia Hemoglobin dropped from about 13 to 9.3 over a 24 hour period and has been as low as 7.6, requiring 1 unit of PRBC on January 03, 2017. No bleeding source identified on EGD or colonoscopy.  Hemoglobin stable today. GI recommending he can restart anticoagulation/antiplatelet if appropriate. Cardiology feels patient is too high risk for long term dual antiplatelet therapy. Hemoglobin dropped to 7.8 today -repeat CBC  Leukocytosis Resolved.  Essential hypertension Patient takes amlodipine and metoprolol as an outpatient.  Normotensive. -Continue to hold amlodipine and metoprolol.  NSTEMI No persistent chest pain. Troponin peaked at 2.42 with no downtrend. Continues to trend up slightly. LHC significant for 90% stenosis of ostial LAD -cardiology recommendations: CABG, heparin drip -Cardiothoracic surgery recommendations: CABG planned for 01/11/2017  Hypokalemia -BMP pending -check magnesium  V-tach 6 beats on 11/17, asymptomatic. -check magnesium -EKG   DVT prophylaxis: SCDs, Heparin drip Code Status: Full code Family Communication: None at bedside Disposition Plan: Discharge pending cardiac management   Consultants:   Cardiology  Gastroenterology  Procedures:    EGD (01/05/2017)  Findings:      The Z-line was regular and was found 40 cm from the incisors.      A widely patent Schatzki ring (acquired) was found at the       gastroesophageal junction.      A few non-bleeding dispersed erosions were found in the gastric body.       Biopsies were taken with a cold forceps for Helicobacter pylori testing.      The cardia and gastric fundus were normal on retroflexion.      The duodenal bulb, first portion of the duodenum and second portion of       the duodenum were normal.  Impression:               -  Z-line regular, 40 cm from the incisors.                           - Widely patent Schatzki ring.                           - Gastric erosions without bleeding. Biopsied.                           - Normal duodenal bulb, first portion of the                            duodenum and second portion of the duodenum.  Recommendation:           - Return patient  to hospital ward for ongoing care.                           - Clear liquid diet.                           - Continue present medications.                           - Await pathology results.                           - Perform a colonoscopy tomorrow.  ---------------------------------------------------------------------------------------------------------   Colonoscopy (01/06/2017)  Findings/recommendations ------------------------------------ - Colonoscopy today showed fair prep, multiple sigmoid diverticulosis, internal hemorrhoids but no evidence of active bleeding. - Okay to start anticoagulation/antiplatelet from GI standpoint. - Recommend repeat colonoscopy as previously  recommended by his primary gastroenterologist  because of fair prep today.  -----------------------------------------------------------------------------------------------------------   Left heart catheterization (01/08/2017)  Conclusion    Ost LAD lesion is 90% stenosed.  Previously placed Mid LAD stent (unknown type) is widely patent.  Ost Ramus lesion is 90% stenosed. This is a small vessel.  Ost RCA to Prox RCA lesion is 20% stenosed.  Previously placed Mid Cx stent (unknown type) is widely patent.  LV end diastolic pressure is normal.  There is no aortic valve stenosis.   Ostial LAD disease in a patient with recent GI bleeding while on Plavix, and new onset AFib which may require longterm anticoagulation.    COnsider surgical referral for LIMA to LAD, and possible surgical management of AFib including oversewing of Left atrial appendage and possibly Maze procedure.    Antiplatelet therapy plus anticoagulation would be high risk for bleeding long term.      Antimicrobials:  None    Subjective: Bowel movement yesterday without melena or hematochezia. No chest pain or dyspnea.  Objective: Vitals:   01/08/17 2054 01/09/17 0035 01/09/17 0438 01/09/17 0806  BP: (!) 103/57 109/69  119/74 115/70  Pulse: 75 78 83   Resp: 18 18 18    Temp: 99.1 F (37.3 C) 99.2 F (37.3 C) 98.6 F (37 C) 98.2 F (36.8 C)  TempSrc: Oral Oral Oral Oral  SpO2: 99% 100% 100% 100%  Weight:   72.3 kg (159 lb 6.4 oz)   Height:        Intake/Output Summary (Last 24 hours) at 01/09/2017 0915 Last data filed at 01/09/2017 0807 Gross per 24 hour  Intake 314.04 ml  Output 1300 ml  Net -985.96 ml   Filed Weights   01/07/17 0530 01/08/17 0300 01/09/17 0438  Weight: 72.9 kg (160 lb 12.8 oz) 72.8 kg (160 lb 6.4 oz) 72.3 kg (159 lb 6.4 oz)    Examination:  General exam: Appears calm and comfortable Respiratory system: Clear to auscultation. Respiratory effort normal. Cardiovascular system: S1 & S2 heard but diminished, RRR. No murmurs, rubs, gallops or clicks. Gastrointestinal system: Abdomen is nondistended, soft and nontender. No organomegaly or masses felt. Normal bowel sounds heard. Central nervous system: Alert and oriented. No focal neurological deficits. Extremities: No edema. No calf tenderness Skin: No cyanosis. No rashes Psychiatry: Judgement and insight appear normal. Mood & affect appropriate.     Data Reviewed: I have personally reviewed following labs and imaging studies  CBC: Recent Labs  Lab 01/04/17 0059  01/05/17 0206 01/06/17 0355 01/07/17 0815 01/08/17 0539 01/09/17 0517  WBC 10.7*   < > 9.9 7.8 10.3 8.4 8.4  NEUTROABS 7.6  --  7.1 5.3 7.5  --   --   HGB 8.3*   < > 8.0* 7.9* 8.2* 8.2* 7.8*  HCT 24.4*   < > 22.6* 22.7* 23.7* 23.8* 23.1*  MCV 97.2   < > 99.1 100.0 99.2 99.6 100.9*  PLT 126*   < > 120* 133* 183 189 212   < > = values in this interval not displayed.   Basic Metabolic Panel: Recent Labs  Lab 01/02/17 1132  01/04/17 0059 01/05/17 0206 01/06/17 0355 01/07/17 0815 01/08/17 0539  NA  --    < > 139 135 137 137 137  K  --    < > 3.8 3.8 3.5 3.4* 3.3*  CL  --    < > 115* 108 110 107 106  CO2  --    < > 22 22 22 24 25   GLUCOSE  --    <  > 97 96 99 100* 93  BUN  --    < > 47* 21* 14 13 16   CREATININE  --    < > 1.05 1.06 1.20 1.22 1.26*  CALCIUM  --    < > 7.5* 8.0* 8.2* 8.5* 8.3*  MG 1.5*  --  1.9 1.8  --   --   --    < > = values in this interval not displayed.   GFR: Estimated Creatinine Clearance: 52.2 mL/min (A) (by C-G formula based on SCr of 1.26 mg/dL (H)). Liver Function Tests: Recent Labs  Lab 01/04/17 0059  AST 31  ALT 17  ALKPHOS 25*  BILITOT 1.4*  PROT 5.1*  ALBUMIN 2.7*   No results for input(s): LIPASE, AMYLASE in the last 168 hours. No results for input(s): AMMONIA in the last 168 hours. Coagulation Profile: Recent Labs  Lab 01/03/17 1030 01/08/17 0539  INR 1.27 1.12   Cardiac Enzymes: Recent Labs  Lab 01/03/17 0258 01/03/17 1147 01/04/17 0058 01/04/17 0621 01/07/17 1247  TROPONINI 0.38* 0.13* 2.07* 2.42* 2.84*   BNP (last 3 results) No results for input(s): PROBNP in the last 8760 hours. HbA1C: No results for input(s): HGBA1C in the last 72 hours. CBG: No results for input(s): GLUCAP in the last 168 hours. Lipid Profile: No results for input(s): CHOL, HDL, LDLCALC, TRIG, CHOLHDL, LDLDIRECT in the last 72 hours. Thyroid Function Tests: No results for input(s): TSH, T4TOTAL, FREET4, T3FREE, THYROIDAB in the last 72 hours. Anemia Panel: No results for input(s): VITAMINB12, FOLATE, FERRITIN, TIBC, IRON, RETICCTPCT  in the last 72 hours. Sepsis Labs: No results for input(s): PROCALCITON, LATICACIDVEN in the last 168 hours.  Recent Results (from the past 240 hour(s))  Culture, blood (Routine X 2) w Reflex to ID Panel     Status: None   Collection Time: 01/02/17 12:35 PM  Result Value Ref Range Status   Specimen Description BLOOD RIGHT ARM  Final   Special Requests   Final    BOTTLES DRAWN AEROBIC AND ANAEROBIC Blood Culture results may not be optimal due to an excessive volume of blood received in culture bottles   Culture NO GROWTH 5 DAYS  Final   Report Status 01/07/2017 FINAL   Final  Culture, blood (Routine X 2) w Reflex to ID Panel     Status: None   Collection Time: 01/02/17 12:50 PM  Result Value Ref Range Status   Specimen Description BLOOD LEFT ARM  Final   Special Requests   Final    BOTTLES DRAWN AEROBIC AND ANAEROBIC Blood Culture results may not be optimal due to an excessive volume of blood received in culture bottles   Culture NO GROWTH 5 DAYS  Final   Report Status 01/07/2017 FINAL  Final  MRSA PCR Screening     Status: None   Collection Time: 01/03/17  1:13 PM  Result Value Ref Range Status   MRSA by PCR NEGATIVE NEGATIVE Final    Comment:        The GeneXpert MRSA Assay (FDA approved for NASAL specimens only), is one component of a comprehensive MRSA colonization surveillance program. It is not intended to diagnose MRSA infection nor to guide or monitor treatment for MRSA infections.          Radiology Studies: No results found.      Scheduled Meds: . amiodarone  400 mg Oral BID  . aspirin  81 mg Oral Daily  . atorvastatin  80 mg Oral Daily  . escitalopram  10 mg Oral Daily  . lidocaine  1 application Urethral Once  . pantoprazole  40 mg Oral BID  . senna  1 tablet Oral BID  . sodium chloride flush  3 mL Intravenous Q12H  . sodium chloride flush  3 mL Intravenous Q12H   Continuous Infusions: . sodium chloride Stopped (01/05/17 1222)  . sodium chloride    . sodium chloride    . heparin 1,000 Units/hr (01/08/17 2322)     LOS: 7 days     Jacquelin Hawkingalph Idris Edmundson, MD [facility] 01/09/2017, 9:15 AM Pager: [phone]  If 7PM-7AM, please contact night-coverage [email or web address] Password [email or web address] 01/09/2017, 9:15 AM

## 2017-01-09 NOTE — Consult Note (Signed)
[address]       [place], [address]             [phone]      Cardiothoracic Surgery Consultation  Reason for Consult: Severe single vessel coronary artery disease and atrial fibrillation with RVR Referring Physician: Dr. Michae Kava is an 71 y.o. male.  HPI:   The patient is a 71 year old gentleman with a history of coronary artery disease who suffered an MI in 2015 after undergoing a left TKR in [place]. Shortly after the procedure he had severe chest pressure and shortness of breath. He underwent stenting of one vessel at that time and then stenting of another vessel a few weeks later according to him. This was done in [place] so we do not have the details. He says that he did well after that without any symptoms. His wife died in 02-18-2016 and he started going to a gym everyday starting in March and lost 50 lbs, getting down to 160 lbs. He was still going to the gym at the time of admission and has felt fine with no chest pain or pressure, normal stamina, no shortness of breath. He was visiting a friend in [place] and got up in the middle of the night to go to the bathroom and in the bathroom had a syncopal episode. He says everything just went black with no symptoms before that. He fell and hit his face on the tub. His friend found him and got him out to a chair. Then he went back to the bathroom and the same thing happened a second time. The friend called the patient's daughter who instructed them to call EMS. In the ER he was noted to be in atrial fib with RVR 180 and responded to Cardizem. He ruled in for a NSTEMI with an initial troponin of 0.05 and then rising to 2.8. His initial BUN was 80's with a creatinine of 1.24. An echo showed an EF of 65-70% with grade 1 diastolic dysfunction. There was no significant valvular dysfunction. He was started on Eliquis for his atrial fibrillation but had GI bleeding and it was stopped. Colonoscopy was  negative for bleeding sites. His EGD showed some non-bleeding gastric erosions.  Cardiac cath today showed a 90% ostial LAD stenosis. The previous stents in the mid LAD are widely patent. There was a small Ramus with a 90% stenosis. The RCA had 20% proximal stenosis. The previously placed LCX stents were patent. LVEDP was normal with no aortic stenosis. CT of the head, face and neck on admission were negative for acute injury other than soft tissue swelling in the right face.  Past Medical History:  Diagnosis Date  . CAD (coronary artery disease) 01/02/2017    Past Surgical History:  Procedure Laterality Date  . COLONOSCOPY WITH PROPOFOL N/A 01/06/2017   Performed by Otis Brace, MD at [facility]  . CORONARY ANGIOPLASTY N/A 01/02/2017  . ESOPHAGOGASTRODUODENOSCOPY (EGD) WITH PROPOFOL N/A 01/05/2017   Performed by Otis Brace, MD at [facility]  . LEFT HEART CATH AND CORONARY ANGIOGRAPHY N/A 01/08/2017   Performed by Jettie Booze, MD at [facility]    Family History  Problem Relation Age of Onset  . Hypertension Father     Social History:  reports that  has never smoked. he has never used smokeless tobacco. He reports that he does not drink alcohol or use drugs.  Allergies: No Known Allergies  Medications:  I have reviewed the patient's current medications. Prior to Admission:  Medications Prior to Admission  Medication Sig Dispense Refill Last Dose  . amLODipine (NORVASC) 5 MG tablet Take 5 mg daily by mouth.   01/01/2017 at Unknown time  . aspirin EC 81 MG tablet Take 81 mg daily by mouth.   01/02/2017 at Unknown time  . atorvastatin (LIPITOR) 20 MG tablet Take 20 mg daily by mouth.   01/02/2017 at Unknown time  . clopidogrel (PLAVIX) 75 MG tablet Take 75 mg daily by mouth.   01/02/2017 at Unknown time  . escitalopram (LEXAPRO) 10 MG tablet Take 10 mg daily by mouth.   01/02/2017 at Unknown time  . meloxicam (MOBIC) 7.5 MG tablet Take 7.5 mg daily by  mouth.   01/01/2017 at Unknown time  . metoprolol tartrate (LOPRESSOR) 25 MG tablet Take 25 mg 2 (two) times daily by mouth.   01/02/2017 at Unknown time  . traZODone (DESYREL) 150 MG tablet Take 75 mg daily as needed by mouth for sleep.    01/01/2017 at Unknown time   Scheduled: . amiodarone  400 mg Oral BID  . aspirin  81 mg Oral Daily  . atorvastatin  80 mg Oral Daily  . escitalopram  10 mg Oral Daily  . lidocaine  1 application Urethral Once  . pantoprazole  40 mg Oral BID  . senna  1 tablet Oral BID  . sodium chloride flush  3 mL Intravenous Q12H  . sodium chloride flush  3 mL Intravenous Q12H   Continuous: . sodium chloride Stopped (01/05/17 1222)  . sodium chloride    . sodium chloride    . heparin 1,000 Units/hr (01/08/17 2322)   PYK:DXIPJA chloride, sodium chloride, acetaminophen **OR** acetaminophen, acetaminophen, albuterol, alum & mag hydroxide-simeth, nitroGLYCERIN, ondansetron **OR** ondansetron (ZOFRAN) IV, polyethylene glycol, sodium chloride flush, sodium chloride flush Anti-infectives (From admission, onward)   None      Results for orders placed or performed during the hospital encounter of 01/02/17 (from the past 48 hour(s))  Troponin I     Status: Abnormal   Collection Time: 01/07/17 12:47 PM  Result Value Ref Range   Troponin I 2.84 (HH) <0.03 ng/mL    Comment: CRITICAL VALUE NOTED.  VALUE IS CONSISTENT WITH PREVIOUSLY REPORTED AND CALLED VALUE.  Basic metabolic panel     Status: Abnormal   Collection Time: 01/08/17  5:39 AM  Result Value Ref Range   Sodium 137 135 - 145 mmol/L   Potassium 3.3 (L) 3.5 - 5.1 mmol/L   Chloride 106 101 - 111 mmol/L   CO2 25 22 - 32 mmol/L   Glucose, Bld 93 65 - 99 mg/dL   BUN 16 6 - 20 mg/dL   Creatinine, Ser 1.26 (H) 0.61 - 1.24 mg/dL   Calcium 8.3 (L) 8.9 - 10.3 mg/dL   GFR calc non Af Amer 55 (L) >60 mL/min   GFR calc Af Amer >60 >60 mL/min    Comment: (NOTE) The eGFR has been calculated using the CKD EPI  equation. This calculation has not been validated in all clinical situations. eGFR's persistently <60 mL/min signify possible Chronic Kidney Disease.    Anion gap 6 5 - 15  Protime-INR     Status: None   Collection Time: 01/08/17  5:39 AM  Result Value Ref Range   Prothrombin Time 14.3 11.4 - 15.2 seconds   INR 1.12   CBC     Status: Abnormal   Collection Time: 01/08/17  5:39 AM  Result Value Ref Range   WBC 8.4 4.0 - 10.5 K/uL   RBC 2.39 (L) 4.22 - 5.81 MIL/uL   Hemoglobin 8.2 (L) 13.0 - 17.0 g/dL   HCT 23.8 (L) 39.0 - 52.0 %   MCV 99.6 78.0 - 100.0 fL   MCH 34.3 (H) 26.0 - 34.0 pg   MCHC 34.5 30.0 - 36.0 g/dL   RDW 17.4 (H) 11.5 - 15.5 %   Platelets 189 150 - 400 K/uL  CBC     Status: Abnormal   Collection Time: 01/09/17  5:17 AM  Result Value Ref Range   WBC 8.4 4.0 - 10.5 K/uL   RBC 2.29 (L) 4.22 - 5.81 MIL/uL   Hemoglobin 7.8 (L) 13.0 - 17.0 g/dL   HCT 23.1 (L) 39.0 - 52.0 %   MCV 100.9 (H) 78.0 - 100.0 fL   MCH 34.1 (H) 26.0 - 34.0 pg   MCHC 33.8 30.0 - 36.0 g/dL   RDW 17.1 (H) 11.5 - 15.5 %   Platelets 212 150 - 400 K/uL  Heparin level (unfractionated)     Status: Abnormal   Collection Time: 01/09/17  7:22 AM  Result Value Ref Range   Heparin Unfractionated 0.29 (L) 0.30 - 0.70 IU/mL    Comment:        IF HEPARIN RESULTS ARE BELOW EXPECTED VALUES, AND PATIENT DOSAGE HAS BEEN CONFIRMED, SUGGEST FOLLOW UP TESTING OF ANTITHROMBIN III LEVELS.     No results found.  Review of Systems  Constitutional: Negative.   HENT: Negative.   Eyes: Negative.   Respiratory: Negative for shortness of breath.   Cardiovascular: Positive for palpitations. Negative for chest pain, orthopnea, leg swelling and PND.  Gastrointestinal: Negative.   Genitourinary: Negative.   Musculoskeletal: Negative.   Skin: Negative.   Neurological: Positive for loss of consciousness.  Endo/Heme/Allergies: Negative.   Psychiatric/Behavioral: Negative.    Blood pressure 115/70, pulse 83,  temperature 98.2 F (36.8 C), temperature source Oral, resp. rate 18, height 5' 9"  (1.753 m), weight 72.3 kg (159 lb 6.4 oz), SpO2 100 %. Physical Exam  Constitutional: He is oriented to person, place, and time. He appears well-developed and well-nourished. No distress.  HENT:  Head: Normocephalic.  Mouth/Throat: Oropharynx is clear and moist.  Ecchymosis and swelling over right cheek  Eyes: Conjunctivae and EOM are normal. Pupils are equal, round, and reactive to light.  Neck: Normal range of motion. Neck supple. No JVD present. No thyromegaly present.  Cardiovascular: Normal rate, regular rhythm, normal heart sounds and intact distal pulses.  No murmur heard. Respiratory: Effort normal and breath sounds normal. No respiratory distress. He has no wheezes. He exhibits no tenderness.  GI: Soft. Bowel sounds are normal. He exhibits no distension and no mass. There is no tenderness.  Musculoskeletal: Normal range of motion.  Lymphadenopathy:    He has no cervical adenopathy.  Neurological: He is alert and oriented to person, place, and time. He has normal strength. No cranial nerve deficit or sensory deficit.  Skin: Skin is warm and dry.           *[facility]*                         [place] [address]  [place], [place] [address]                            [phone]  ------------------------------------------------------------------- Transthoracic Echocardiography  Patient:    Kelven, Flater MR #:       [id number] Study Date: 01/03/2017 Gender:     M Age:        53 Height:     175.3 cm Weight:     73.6 kg BSA:        1.9 m^2 Pt. Status: Room:       6E04C   PERFORMING   [facility], Inpatient  ADMITTING    Emokpae, Courage  Theador Hawthorne, Courage  REFERRING    Emokpae, Courage  ATTENDING    Georgette Shell  SONOGRAPHER  Jannett Celestine,  RDCS  cc:  ------------------------------------------------------------------- LV EF: 65% -   70%  ------------------------------------------------------------------- Indications:      Atrial fibrillation - 427.31.  ------------------------------------------------------------------- History:   PMH:   Syncope.  Atrial fibrillation.  Coronary artery disease.  Risk factors:  Hypertension.  ------------------------------------------------------------------- Study Conclusions  - Left ventricle: The cavity size was normal. Wall thickness was   increased in a pattern of mild LVH. Systolic function was   vigorous. The estimated ejection fraction was in the range of 65%   to 70%. Wall motion was normal; there were no regional wall   motion abnormalities. Doppler parameters are consistent with   abnormal left ventricular relaxation (grade 1 diastolic   dysfunction). - Aortic valve: Mildly calcified annulus. A bicuspid morphology   cannot be excluded; mildly calcified leaflets. - Right atrium: Central venous pressure (est): 3 mm Hg. - Tricuspid valve: There was trivial regurgitation. - Pulmonary arteries: PA peak pressure: 29 mm Hg (S). - Pericardium, extracardiac: There was no pericardial effusion.  Impressions:  - Mild LVH with LVEF 65-70% and grade 1 diastolic dysfunction.   Mildly calcified aortic annulus, cannot exclude bicuspid   morphology with mildly calcified leaflets. Trivial tricuspid   regurgitation with estimated PASP 29 mmHg.  ------------------------------------------------------------------- Study data:  No prior study was available for comparison.  Study status:  Routine.  Procedure:  The patient reported no pain pre or post test. Transthoracic echocardiography. Image quality was adequate.          Transthoracic echocardiography.  M-mode, complete 2D, spectral Doppler, and color Doppler.  Birthdate: Patient birthdate: 09/26/1945.  Age:  Patient is 71 yr old.   Sex: Gender: male.    BMI: 24 kg/m^2.  Blood pressure:     106/58 Patient status:  Inpatient.  Study date:  Study date: 01/03/2017. Study time: 12:21 PM.  Location:  Bedside.  -------------------------------------------------------------------  ------------------------------------------------------------------- Left ventricle:  The cavity size was normal. Wall thickness was increased in a pattern of mild LVH. Systolic function was vigorous. The estimated ejection fraction was in the range of 65% to 70%. Wall motion was normal; there were no regional wall motion abnormalities. Doppler parameters are consistent with abnormal left ventricular relaxation (grade 1 diastolic dysfunction).  ------------------------------------------------------------------- Aortic valve:   Mildly calcified annulus. A bicuspid morphology cannot be excluded; mildly calcified leaflets.  Doppler:  There was no significant regurgitation.  ------------------------------------------------------------------- Aorta:  Aortic root: The aortic root was normal in size.  ------------------------------------------------------------------- Mitral valve:   Doppler:     Peak gradient (D): 2 mm Hg.  ------------------------------------------------------------------- Left atrium:  The atrium was normal in size.  ------------------------------------------------------------------- Right ventricle:  The cavity  size was normal. Systolic function was normal.  ------------------------------------------------------------------- Pulmonic valve:    The valve appears to be grossly normal. Doppler:  There was no significant regurgitation.  ------------------------------------------------------------------- Tricuspid valve:   The valve appears to be grossly normal. Doppler:  There was trivial regurgitation.  ------------------------------------------------------------------- Right atrium:  The atrium was normal in  size.  ------------------------------------------------------------------- Pericardium:  There was no pericardial effusion.  ------------------------------------------------------------------- Systemic veins: Inferior vena cava: The vessel was normal in size. The respirophasic diameter changes were in the normal range (>= 50%), consistent with normal central venous pressure.  ------------------------------------------------------------------- Measurements   Left ventricle                         Value        Reference  LV ID, ED, PLAX chordal        (L)     38.6  mm     43 - 52  LV ID, ES, PLAX chordal                25.4  mm     23 - 38  LV fx shortening, PLAX chordal         34    %      >=29  LV PW thickness, ED                    9.77  mm     ----------  IVS/LV PW ratio, ED                    1.18         <=1.3  Stroke volume, 2D                      101   ml     ----------  Stroke volume/bsa, 2D                  53    ml/m^2 ----------  LV e&', lateral                         9.68  cm/s   ----------  LV E/e&', lateral                       7.46         ----------  LV e&', medial                          6.64  cm/s   ----------  LV E/e&', medial                        10.87        ----------  LV e&', average                         8.16  cm/s   ----------  LV E/e&', average                       8.85         ----------    Ventricular septum                     Value        Reference  IVS thickness, ED  11.5  mm     ----------    LVOT                                   Value        Reference  LVOT ID, S                             21    mm     ----------  LVOT area                              3.46  cm^2   ----------  LVOT peak velocity, S                  175   cm/s   ----------  LVOT mean velocity, S                  121   cm/s   ----------  LVOT VTI, S                            29.3  cm     ----------  LVOT peak gradient, S                  12    mm  Hg  ----------    Aorta                                  Value        Reference  Aortic root ID, ED                     36    mm     ----------    Left atrium                            Value        Reference  LA ID, A-P, ES                         28    mm     ----------  LA ID/bsa, A-P                         1.47  cm/m^2 <=2.2  LA volume, S                           36.4  ml     ----------  LA volume/bsa, S                       19.2  ml/m^2 ----------  LA volume, ES, 1-p A4C                 28.5  ml     ----------  LA volume/bsa, ES, 1-p A4C             15    ml/m^2 ----------  LA volume, ES, 1-p A2C  38    ml     ----------  LA volume/bsa, ES, 1-p A2C             20    ml/m^2 ----------    Mitral valve                           Value        Reference  Mitral E-wave peak velocity            72.2  cm/s   ----------  Mitral A-wave peak velocity            118   cm/s   ----------  Mitral deceleration time               187   ms     150 - 230  Mitral peak gradient, D                2     mm Hg  ----------  Mitral E/A ratio, peak                 0.6          ----------    Pulmonary arteries                     Value        Reference  PA pressure, S, DP                     29    mm Hg  <=30    Tricuspid valve                        Value        Reference  Tricuspid regurg peak velocity         255   cm/s   ----------  Tricuspid peak RV-RA gradient          26    mm Hg  ----------    Right atrium                           Value        Reference  RA ID, S-I, ES, A4C            (H)     55.4  mm     34 - 49  RA area, ES, A4C                       17.1  cm^2   8.3 - 19.5  RA volume, ES, A/L                     43.6  ml     ----------  RA volume/bsa, ES, A/L                 23    ml/m^2 ----------    Systemic veins                         Value        Reference  Estimated CVP                          3     mm Hg  ----------  Right ventricle                        Value         Reference  TAPSE                                  20.9  mm     ----------  RV pressure, S, DP                     29    mm Hg  <=30  RV s&', lateral, S                      13.9  cm/s   ----------  Legend: (L)  and  (H)  mark values outside specified reference range.  ------------------------------------------------------------------- Prepared and Electronically Authenticated by  Rozann Lesches, M.D. 2018-11-11T13:20:01  Physicians   Panel Physicians Referring Physician Case Authorizing Physician  Jettie Booze, MD (Primary)    Procedures   LEFT HEART CATH AND CORONARY ANGIOGRAPHY  Conclusion     Ost LAD lesion is 90% stenosed.  Previously placed Mid LAD stent (unknown type) is widely patent.  Ost Ramus lesion is 90% stenosed. This is a small vessel.  Ost RCA to Prox RCA lesion is 20% stenosed.  Previously placed Mid Cx stent (unknown type) is widely patent.  LV end diastolic pressure is normal.  There is no aortic valve stenosis.   Ostial LAD disease in a patient with recent GI bleeding while on Plavix, and new onset AFib which may require longterm anticoagulation.    COnsider surgical referral for LIMA to LAD, and possible surgical management of AFib including oversewing of Left atrial appendage and possibly Maze procedure.    Antiplatelet therapy plus anticoagulation would be high risk for bleeding long term.     Indications   Non-ST elevation (NSTEMI) myocardial infarction ([place]) [I21.4 (ICD-10-CM)]  Procedural Details/Technique   Technical Details The risks, benefits, and details of the procedure were explained to the patient. The patient verbalized understanding and wanted to proceed. Informed written consent was obtained.  PROCEDURE TECHNIQUE: After Xylocaine anesthesia a 58F slender sheath was placed in the right radial artery with a single anterior needle wall stick. IV Heparin was given. Left coronary angiography was done using a  [place]kins L3.5 guide catheter. Left heart cath was done using a JR4catheter. JR4 and [place] were unsuccessful for selective engagement of the RCA. Right coronary angiography was done using an AL1 guide catheter. A TR band was used for hemostasis.  Contrast: 155     Estimated blood loss <50 mL.  During this procedure the patient was administered the following to achieve and maintain moderate conscious sedation: Versed 2 mg, Fentanyl 25 mcg, while the patient's heart rate, blood pressure, and oxygen saturation were continuously monitored. The period of conscious sedation was 31 minutes, of which I was present face-to-face 100% of this time.  Complications   Complications documented before study signed (01/08/2017 12:04 PM EST)    No complications were associated with this study.  Documented by Jettie Booze, MD - 01/08/2017 11:59 AM EST    Coronary Findings   Diagnostic  Dominance: Right  Left Anterior Descending  Ost LAD lesion 90% stenosed  Ost LAD lesion is 90% stenosed.  Mid LAD lesion 0% stenosed  Previously placed Mid LAD stent (unknown type)  is widely patent.  Ramus Intermedius  Ost Ramus lesion 90% stenosed  Ost Ramus lesion is 90% stenosed.  Left Circumflex  Mid Cx lesion 0% stenosed  Previously placed Mid Cx stent (unknown type) is widely patent.  Right Coronary Artery  Ost RCA to Prox RCA lesion 20% stenosed  Ost RCA to Prox RCA lesion is 20% stenosed.  Intervention   No interventions have been documented.  Left Heart   Left Ventricle LV end diastolic pressure is normal.  Aortic Valve There is no aortic valve stenosis.  Coronary Diagrams   Diagnostic Diagram       Implants     No implant documentation for this case.  MERGE Images   Show images for CARDIAC CATHETERIZATION   Link to Procedure Log   Procedure Log    Hemo Data    Most Recent Value  AO Systolic Pressure 97 mmHg  AO Diastolic Pressure 49 mmHg  AO Mean 70 mmHg  LV Systolic  Pressure 93 mmHg  LV Diastolic Pressure 0 mmHg  LV EDP 7 mmHg  Arterial Occlusion Pressure Extended Systolic Pressure 98 mmHg  Arterial Occlusion Pressure Extended Diastolic Pressure 50 mmHg  Arterial Occlusion Pressure Extended Mean Pressure 72 mmHg  Left Ventricular Apex Extended Systolic Pressure 315 mmHg  Left Ventricular Apex Extended Diastolic Pressure 1 mmHg  Left Ventricular Apex Extended EDP Pressure 11 mmHg    Assessment/Plan:  This 71 year old gentleman has a high grade ostial LAD stenosis and patent stents in the mid LAD and LCX. He presented with two syncopal episodes within a short period of time and was in atrial fib with RVR on presentation to the ER. I am not convinced that the atrial fib was responsible for his syncope but he could have had an ischemic arrhythmia like VT although it has not been documented. I agree that CABG with a LIMA to the LAD and MAZE procedure with clipping of the LAA is the best treatment for this patient. He may not be able to be maintained on anticoagulation with this GI bleeding risk although he has tolerated ASA and Plavix prior to admission.   I discussed the operative procedure with the patient and his daughter by telephone including alternatives, benefits and risks; including but not limited to bleeding, blood transfusion, infection, stroke, myocardial infarction, graft failure, heart block requiring a permanent pacemaker, organ dysfunction, and death.  Elray Buba understands and agrees to proceed.  We will schedule surgery for Monday.   I spent 60 minutes performing this consultation and > 50% of this time was spent face to face counseling and coordinating the care of this patient's coronary artery disease and atrial fibrillation.   Gaye Pollack 01/08/2017

## 2017-01-09 NOTE — Progress Notes (Addendum)
Patient had 6 beats VTACH at 0745.  Dr Caleb PoppNettey notified.

## 2017-01-09 NOTE — Progress Notes (Signed)
ANTICOAGULATION CONSULT NOTE  Pharmacy Consult for heparin Indication: chest pain/ACS and atrial fibrillation  No Known Allergies  Patient Measurements: Height: 5\' 9"  (175.3 cm) Weight: 159 lb 6.4 oz (72.3 kg) IBW/kg (Calculated) : 70.7 Heparin Dosing Weight: 72.3 kg  Vital Signs: Temp: 98.7 F (37.1 C) (11/17 2011) Temp Source: Oral (11/17 2011) BP: 123/67 (11/17 2011) Pulse Rate: 72 (11/17 2011)  Labs: Recent Labs    01/07/17 0815 01/07/17 1247 01/08/17 0539 01/09/17 0517 01/09/17 0722 01/09/17 0922 01/09/17 2023  HGB 8.2*  --  8.2* 7.8*  --   --   --   HCT 23.7*  --  23.8* 23.1*  --   --   --   PLT 183  --  189 212  --   --   --   LABPROT  --   --  14.3  --   --   --   --   INR  --   --  1.12  --   --   --   --   HEPARINUNFRC  --   --   --   --  0.29*  --  0.25*  CREATININE 1.22  --  1.26*  --   --  1.20  --   TROPONINI  --  2.84*  --   --   --   --   --     Estimated Creatinine Clearance: 54.8 mL/min (by C-G formula based on SCr of 1.2 mg/dL).   Medical History: Past Medical History:  Diagnosis Date  . CAD (coronary artery disease) 01/02/2017    Medications:  Scheduled:  . amiodarone  400 mg Oral BID  . aspirin  81 mg Oral Daily  . atorvastatin  80 mg Oral Daily  . escitalopram  10 mg Oral Daily  . lidocaine  1 application Urethral Once  . pantoprazole  40 mg Oral BID  . senna  1 tablet Oral BID  . sodium chloride flush  3 mL Intravenous Q12H  . sodium chloride flush  3 mL Intravenous Q12H    Assessment: 71 yom who was started on apixaban PTA. Last documented dose 11/10. Was worked up subsequently for GI bleed due to decrease in hemoglobin. Underwent colonoscopy and EGD showing multiple sigmoid diverticulosis and internal hemorrhoids with no evidence of active bleeding. GI ok'd to start anticoagulation. Now s/p cath - started on heparin in anticipation of CABG  Heparin level remains subtherapeutic, decreased slightly after rate increase to 0.25. No  active bleeding or IV line issues per RN. To notify Pharmacy if any bleeding issues arise.  Goal of Therapy:  Heparin level 0.3-0.7 units/ml Monitor platelets by anticoagulation protocol: Yes   Plan:  Increase heparin to 1200 units/hr 8 hour heparin level Daily heparin level, CBC Monitor closely for s/sx bleeding  Babs BertinHaley Tambi Thole, PharmD, BCPS Clinical Pharmacist 01/09/2017 9:43 PM

## 2017-01-09 NOTE — Progress Notes (Signed)
Prelim: Carotid duplex- 1-39% Right ICA stenosis, 40-59% Left ICA stenosis. UE Doppler- Right Decreases >50% with radial compression, normal with ulnar compression. Left obliterates with radial compression, normal with ulnar compression.  Farrel DemarkJill Eunice, RDMS, RVT

## 2017-01-10 ENCOUNTER — Inpatient Hospital Stay (HOSPITAL_COMMUNITY): Payer: Medicare HMO

## 2017-01-10 LAB — COMPREHENSIVE METABOLIC PANEL
ALBUMIN: 3.6 g/dL (ref 3.5–5.0)
ALK PHOS: 54 U/L (ref 38–126)
ALT: 20 U/L (ref 17–63)
AST: 26 U/L (ref 15–41)
Anion gap: 7 (ref 5–15)
BILIRUBIN TOTAL: 0.5 mg/dL (ref 0.3–1.2)
BUN: 19 mg/dL (ref 6–20)
CALCIUM: 8.9 mg/dL (ref 8.9–10.3)
CO2: 25 mmol/L (ref 22–32)
CREATININE: 1.35 mg/dL — AB (ref 0.61–1.24)
Chloride: 104 mmol/L (ref 101–111)
GFR calc Af Amer: 59 mL/min — ABNORMAL LOW (ref >60)
GFR, EST NON AFRICAN AMERICAN: 50 mL/min — AB (ref >60)
GLUCOSE: 121 mg/dL — AB (ref 65–99)
Potassium: 3.8 mmol/L (ref 3.5–5.1)
Sodium: 136 mmol/L (ref 135–145)
TOTAL PROTEIN: 7.1 g/dL (ref 6.5–8.1)

## 2017-01-10 LAB — BASIC METABOLIC PANEL
ANION GAP: 6 (ref 5–15)
BUN: 20 mg/dL (ref 6–20)
CHLORIDE: 106 mmol/L (ref 101–111)
CO2: 24 mmol/L (ref 22–32)
Calcium: 8.6 mg/dL — ABNORMAL LOW (ref 8.9–10.3)
Creatinine, Ser: 1.24 mg/dL (ref 0.61–1.24)
GFR calc Af Amer: >60 mL/min (ref >60)
GFR, EST NON AFRICAN AMERICAN: 56 mL/min — AB (ref >60)
GLUCOSE: 93 mg/dL (ref 65–99)
POTASSIUM: 4 mmol/L (ref 3.5–5.1)
Sodium: 136 mmol/L (ref 135–145)

## 2017-01-10 LAB — HEMOGLOBIN A1C
HEMOGLOBIN A1C: 4.8 % (ref 4.8–5.6)
Mean Plasma Glucose: 91.06 mg/dL

## 2017-01-10 LAB — CBC
HEMATOCRIT: 24.7 % — AB (ref 39.0–52.0)
HEMOGLOBIN: 8.3 g/dL — AB (ref 13.0–17.0)
MCH: 33.7 pg (ref 26.0–34.0)
MCHC: 33.6 g/dL (ref 30.0–36.0)
MCV: 100.4 fL — AB (ref 78.0–100.0)
PLATELETS: 245 10*3/uL (ref 150–400)
RBC: 2.46 MIL/uL — AB (ref 4.22–5.81)
RDW: 16.6 % — ABNORMAL HIGH (ref 11.5–15.5)
WBC: 8.6 10*3/uL (ref 4.0–10.5)

## 2017-01-10 LAB — APTT: aPTT: 73 seconds — ABNORMAL HIGH (ref 24–36)

## 2017-01-10 LAB — BLOOD GAS, ARTERIAL
Acid-Base Excess: 0.5 mmol/L (ref 0.0–2.0)
BICARBONATE: 24.6 mmol/L (ref 20.0–28.0)
Drawn by: 244851
O2 Saturation: 94.9 %
PCO2 ART: 39.4 mmHg (ref 32.0–48.0)
PH ART: 7.412 (ref 7.350–7.450)
PO2 ART: 79.5 mmHg — AB (ref 83.0–108.0)
Patient temperature: 98.6

## 2017-01-10 LAB — SURGICAL PCR SCREEN
MRSA, PCR: NEGATIVE
Staphylococcus aureus: POSITIVE — AB

## 2017-01-10 LAB — HEPARIN LEVEL (UNFRACTIONATED)
Heparin Unfractionated: 0.51 IU/mL (ref 0.30–0.70)
Heparin Unfractionated: 0.58 IU/mL (ref 0.30–0.70)

## 2017-01-10 LAB — PREPARE RBC (CROSSMATCH)

## 2017-01-10 MED ORDER — CHLORHEXIDINE GLUCONATE CLOTH 2 % EX PADS
6.0000 | MEDICATED_PAD | Freq: Every day | CUTANEOUS | Status: DC
  Administered 2017-01-10 – 2017-01-11 (×2): 6 via TOPICAL

## 2017-01-10 MED ORDER — TEMAZEPAM 15 MG PO CAPS: 15.0000 mg | ORAL_CAPSULE | Freq: Once | ORAL | Status: DC | PRN

## 2017-01-10 MED ORDER — CHLORHEXIDINE GLUCONATE 0.12 % MT SOLN
15.0000 mL | Freq: Once | OROMUCOSAL | Status: AC
  Administered 2017-01-11: 15 mL via OROMUCOSAL
  Filled 2017-01-10: qty 15

## 2017-01-10 MED ORDER — PLASMA-LYTE 148 IV SOLN
INTRAVENOUS | Status: AC
  Administered 2017-01-11: 500 mL
  Filled 2017-01-10: qty 2.5

## 2017-01-10 MED ORDER — MUPIROCIN 2 % EX OINT
1.0000 "application " | TOPICAL_OINTMENT | Freq: Two times a day (BID) | CUTANEOUS | Status: DC
  Administered 2017-01-10: 1 via NASAL
  Filled 2017-01-10: qty 22

## 2017-01-10 MED ORDER — TRANEXAMIC ACID 1000 MG/10ML IV SOLN
1.5000 mg/kg/h | INTRAVENOUS | Status: AC
  Administered 2017-01-11: 1.5 mg/kg/h via INTRAVENOUS
  Filled 2017-01-10: qty 25

## 2017-01-10 MED ORDER — DEXMEDETOMIDINE HCL IN NACL 400 MCG/100ML IV SOLN
0.1000 ug/kg/h | INTRAVENOUS | Status: AC
  Administered 2017-01-11: .7 ug/kg/h via INTRAVENOUS
  Filled 2017-01-10: qty 100

## 2017-01-10 MED ORDER — DOPAMINE-DEXTROSE 3.2-5 MG/ML-% IV SOLN
0.0000 ug/kg/min | INTRAVENOUS | Status: DC
  Filled 2017-01-10 (×2): qty 250

## 2017-01-10 MED ORDER — VANCOMYCIN HCL 10 G IV SOLR
1250.0000 mg | INTRAVENOUS | Status: AC
  Administered 2017-01-11: 1250 mg via INTRAVENOUS
  Filled 2017-01-10: qty 1250

## 2017-01-10 MED ORDER — MAGNESIUM SULFATE 50 % IJ SOLN
40.0000 meq | INTRAMUSCULAR | Status: DC
  Filled 2017-01-10: qty 9.85

## 2017-01-10 MED ORDER — DIAZEPAM 5 MG PO TABS
5.0000 mg | ORAL_TABLET | Freq: Once | ORAL | Status: AC
  Administered 2017-01-11: 5 mg via ORAL
  Filled 2017-01-10: qty 1

## 2017-01-10 MED ORDER — TRANEXAMIC ACID (OHS) PUMP PRIME SOLUTION
2.0000 mg/kg | INTRAVENOUS | Status: DC
  Filled 2017-01-10: qty 1.45

## 2017-01-10 MED ORDER — BISACODYL 5 MG PO TBEC
5.0000 mg | DELAYED_RELEASE_TABLET | Freq: Once | ORAL | Status: AC
  Administered 2017-01-10: 5 mg via ORAL
  Filled 2017-01-10: qty 1

## 2017-01-10 MED ORDER — DEXTROSE 5 % IV SOLN
1.5000 g | INTRAVENOUS | Status: AC
  Administered 2017-01-11: 1.5 g via INTRAVENOUS
  Administered 2017-01-11: .75 g via INTRAVENOUS
  Filled 2017-01-10: qty 1.5

## 2017-01-10 MED ORDER — CEFUROXIME SODIUM 750 MG IJ SOLR
750.0000 mg | INTRAMUSCULAR | Status: DC
  Filled 2017-01-10: qty 750

## 2017-01-10 MED ORDER — SODIUM CHLORIDE 0.9 % IV SOLN
INTRAVENOUS | Status: AC
  Administered 2017-01-11: .9 [IU]/h via INTRAVENOUS
  Filled 2017-01-10: qty 1

## 2017-01-10 MED ORDER — POTASSIUM CHLORIDE 2 MEQ/ML IV SOLN
80.0000 meq | INTRAVENOUS | Status: DC
  Filled 2017-01-10: qty 40

## 2017-01-10 MED ORDER — CHLORHEXIDINE GLUCONATE CLOTH 2 % EX PADS
6.0000 | MEDICATED_PAD | Freq: Once | CUTANEOUS | Status: AC
  Administered 2017-01-10: 6 via TOPICAL

## 2017-01-10 MED ORDER — PHENYLEPHRINE HCL 10 MG/ML IJ SOLN
30.0000 ug/min | INTRAMUSCULAR | Status: AC
  Administered 2017-01-11: 50 ug/min via INTRAVENOUS
  Filled 2017-01-10: qty 2

## 2017-01-10 MED ORDER — HEPARIN SODIUM (PORCINE) 1000 UNIT/ML IJ SOLN
INTRAMUSCULAR | Status: DC
  Filled 2017-01-10: qty 30

## 2017-01-10 MED ORDER — NITROGLYCERIN IN D5W 200-5 MCG/ML-% IV SOLN
2.0000 ug/min | INTRAVENOUS | Status: DC
  Filled 2017-01-10 (×2): qty 250

## 2017-01-10 MED ORDER — EPINEPHRINE PF 1 MG/ML IJ SOLN
0.0000 ug/min | INTRAMUSCULAR | Status: DC
  Filled 2017-01-10: qty 4

## 2017-01-10 MED ORDER — METOPROLOL TARTRATE 12.5 MG HALF TABLET
12.5000 mg | ORAL_TABLET | Freq: Once | ORAL | Status: AC
  Administered 2017-01-11: 12.5 mg via ORAL
  Filled 2017-01-10: qty 1

## 2017-01-10 MED ORDER — TRANEXAMIC ACID (OHS) BOLUS VIA INFUSION
15.0000 mg/kg | INTRAVENOUS | Status: AC
  Administered 2017-01-11: 1084.5 mg via INTRAVENOUS
  Filled 2017-01-10: qty 1085

## 2017-01-10 MED ORDER — MILRINONE LACTATE IN DEXTROSE 20-5 MG/100ML-% IV SOLN
0.1250 ug/kg/min | INTRAVENOUS | Status: DC
  Filled 2017-01-10: qty 100

## 2017-01-10 NOTE — Progress Notes (Signed)
ANTICOAGULATION CONSULT NOTE  Pharmacy Consult for heparin Indication: chest pain/ACS and atrial fibrillation  No Known Allergies  Patient Measurements: Height: 5\' 9"  (175.3 cm) Weight: 159 lb 6.4 oz (72.3 kg) IBW/kg (Calculated) : 70.7 Heparin Dosing Weight: 72.3 kg  Vital Signs: Temp: 98.7 F (37.1 C) (11/18 0509) Temp Source: Oral (11/18 0509) BP: 121/64 (11/18 0445) Pulse Rate: 68 (11/18 0445)  Labs: Recent Labs    01/07/17 1247 01/08/17 0539 01/09/17 0517 01/09/17 0722 01/09/17 0922 01/09/17 2023 01/10/17 0527  HGB  --  8.2* 7.8*  --   --   --  8.3*  HCT  --  23.8* 23.1*  --   --   --  24.7*  PLT  --  189 212  --   --   --  245  LABPROT  --  14.3  --   --   --   --   --   INR  --  1.12  --   --   --   --   --   HEPARINUNFRC  --   --   --  0.29*  --  0.25* 0.51  CREATININE  --  1.26*  --   --  1.20  --  1.24  TROPONINI 2.84*  --   --   --   --   --   --     Estimated Creatinine Clearance: 53.1 mL/min (by C-G formula based on SCr of 1.24 mg/dL).   Medical History: Past Medical History:  Diagnosis Date  . CAD (coronary artery disease) 01/02/2017    Medications:  Scheduled:  . amiodarone  400 mg Oral BID  . aspirin  81 mg Oral Daily  . atorvastatin  80 mg Oral Daily  . escitalopram  10 mg Oral Daily  . lidocaine  1 application Urethral Once  . pantoprazole  40 mg Oral BID  . senna  1 tablet Oral BID  . sodium chloride flush  3 mL Intravenous Q12H  . sodium chloride flush  3 mL Intravenous Q12H    Assessment: 71 yom on started on apixaban for new afib and subsequently stopped for GIB, with last dose 11/10. GI ok'd to start anticoagulation s/p colonoscopy. Now s/p cath - started on heparin in anticipation of CABG.  Heparin level therapeutic at 0.51. CBC stable, no overt s/s bleeding noted.   Goal of Therapy:  Heparin level 0.3-0.7 units/ml Monitor platelets by anticoagulation protocol: Yes   Plan:  Continue heparin gtt at 1200 units/hr Confirm  heparin level in 8 hrs Daily heparin level and CBC Monitor for s/s bleeding   Einar CrowKatherine Weigle, PharmD Clinical Pharmacist 01/10/17 7:03 AM

## 2017-01-10 NOTE — Progress Notes (Signed)
ANTICOAGULATION CONSULT NOTE  Pharmacy Consult for heparin Indication: chest pain/ACS and atrial fibrillation  No Known Allergies  Patient Measurements: Height: 5\' 9"  (175.3 cm) Weight: 159 lb 6.4 oz (72.3 kg) IBW/kg (Calculated) : 70.7 Heparin Dosing Weight: 72.3 kg  Vital Signs: Temp: 98.1 F (36.7 C) (11/18 1241) Temp Source: Oral (11/18 1241) BP: 121/63 (11/18 1241) Pulse Rate: 68 (11/18 0445)  Labs: Recent Labs    01/08/17 0539 01/09/17 0517  01/09/17 0922 01/09/17 2023 01/10/17 0527 01/10/17 1431  HGB 8.2* 7.8*  --   --   --  8.3*  --   HCT 23.8* 23.1*  --   --   --  24.7*  --   PLT 189 212  --   --   --  245  --   LABPROT 14.3  --   --   --   --   --   --   INR 1.12  --   --   --   --   --   --   HEPARINUNFRC  --   --    < >  --  0.25* 0.51 0.58  CREATININE 1.26*  --   --  1.20  --  1.24  --    < > = values in this interval not displayed.    Estimated Creatinine Clearance: 53.1 mL/min (by C-G formula based on SCr of 1.24 mg/dL).   Medical History: Past Medical History:  Diagnosis Date  . CAD (coronary artery disease) 01/02/2017    Medications:  Scheduled:  . amiodarone  400 mg Oral BID  . aspirin  81 mg Oral Daily  . atorvastatin  80 mg Oral Daily  . bisacodyl  5 mg Oral Once  . [START ON 01/11/2017] chlorhexidine  15 mL Mouth/Throat Once  . [START ON 01/11/2017] diazepam  5 mg Oral Once  . escitalopram  10 mg Oral Daily  . [START ON 01/11/2017] heparin-papaverine-plasmalyte irrigation   Irrigation To OR  . lidocaine  1 application Urethral Once  . [START ON 01/11/2017] magnesium sulfate  40 mEq Other To OR  . [START ON 01/11/2017] metoprolol tartrate  12.5 mg Oral Once  . pantoprazole  40 mg Oral BID  . [START ON 01/11/2017] potassium chloride  80 mEq Other To OR  . senna  1 tablet Oral BID  . sodium chloride flush  3 mL Intravenous Q12H  . sodium chloride flush  3 mL Intravenous Q12H  . [START ON 01/11/2017] tranexamic acid  15 mg/kg  Intravenous To OR  . [START ON 01/11/2017] tranexamic acid  2 mg/kg Intracatheter To OR    Assessment: 71 yom who was started on apixaban PTA. Last documented dose 11/10. Was worked up subsequently for GI bleed due to decrease in hemoglobin. Underwent colonoscopy and EGD showing multiple sigmoid diverticulosis and internal hemorrhoids with no evidence of active bleeding. GI ok'd to start anticoagulation. Now s/p cath - started on heparin in anticipation of CABG. CABG on 11/19.  Heparin level therapeutic x2. No active bleeding or IV line issues per RN. To notify Pharmacy if any bleeding issues arise.  Goal of Therapy:  Heparin level 0.3-0.7 units/ml Monitor platelets by anticoagulation protocol: Yes   Plan:  Continue heparin 1200 units/hr Daily heparin level, CBC Monitor closely for s/sx bleeding Follow up anticoagulation plan before/after CABG.  Stephen Crawford, PharmD PGY1 Acute Care Pharmacy Resident Pager: [phone] 01/10/2017 3:22 PM

## 2017-01-10 NOTE — Progress Notes (Signed)
PROGRESS NOTE    Stephen Crawford  WUJ:[id number] DOB: 1945-10-08 DOA: 01/02/2017 PCP: System, Pcp Not In   Brief Narrative: Stephen Crawford is a 71 y.o. male with a history of CAD, MI status post stents times 07/12/2013.  He presented after having 2 syncopal episodes at home associated facial trauma.  He is found to have A. fib and RVR.  He was started on Cardizem drip and switched to amiodarone.  He was also started on Eliquis but subsequently suffered GI bleeding.  Eliquis was discontinued and GI consulted for evaluation of upper versus lower GI bleed.  He required 1 units of blood transfusions to date.  EGD was performed on 11/13 and was significant for nonbleeding gastric erosions.  Colonoscopy is planned for 11/14.  Cardiology plans on performing a heart cath week prior to discharge.  Patient has no recurrent GI bleeding.  Hemoglobin is slightly down but appears stable.   Assessment & Plan:   Principal Problem:   Paroxysmal atrial fibrillation with RVR (HCC) Active Problems:   Syncope and collapse   CAD/stents   HTN (hypertension)   Non-ST elevation (NSTEMI) myocardial infarction (HCC)   Acute blood loss anemia   Atrial fibrillation with RVR Patient with a CHA2DS2-VASc Score of 3.  Patient was initially started on Eliquis but discontinued secondary to GI bleeding. Rate is controlled on amiodarone. -Cardiology recommendations: consider surgical management of Afib -Cardiothoracic surgery recommendations -Continue amiodarone  Syncope Concerned this could be cardiogenic.  It seems that this may have been secondary to atrial fibrillation.  Patient with soft blood pressures in the setting of taking antihypertensives, which may also contributed.  Cardiology consulted as above.  Acute GI bleeding Acute blood loss anemia Hemoglobin dropped from about 13 to 9.3 over a 24 hour period and has been as low as 7.6, requiring 1 unit of PRBC on January 03, 2017. No bleeding source identified on EGD  or colonoscopy. Hemoglobin stable today. GI recommending he can restart anticoagulation/antiplatelet if appropriate. Cardiology feels patient is too high risk for long term dual antiplatelet therapy. Stable.   Leukocytosis Resolved.  Essential hypertension Patient takes amlodipine and metoprolol as an outpatient.  Normotensive. -Continue to hold amlodipine and metoprolol.  NSTEMI No persistent chest pain. Troponin peaked at 2.42 with no downtrend. Continues to trend up slightly. LHC significant for 90% stenosis of ostial LAD -cardiology recommendations: CABG, heparin drip -Cardiothoracic surgery recommendations: CABG planned for 01/11/2017  Hypokalemia Resolved with supplementation. Magnesium wnl.  V-tach 6 beats on 11/17, asymptomatic. Magnesium wnl.   Right bundle branch block Unknown chronicity. If new, will be managed per above problems.   DVT prophylaxis: SCDs, Heparin drip Code Status: Full code Family Communication: None at bedside Disposition Plan: Discharge pending cardiac management   Consultants:   Cardiology  Gastroenterology  Procedures:    EGD (01/05/2017)  Findings:      The Z-line was regular and was found 40 cm from the incisors.      A widely patent Schatzki ring (acquired) was found at the       gastroesophageal junction.      A few non-bleeding dispersed erosions were found in the gastric body.       Biopsies were taken with a cold forceps for Helicobacter pylori testing.      The cardia and gastric fundus were normal on retroflexion.      The duodenal bulb, first portion of the duodenum and second portion of       the duodenum were  normal.  Impression:               - Z-line regular, 40 cm from the incisors.                           - Widely patent Schatzki ring.                           - Gastric erosions without bleeding. Biopsied.                           - Normal duodenal bulb, first portion of the                            duodenum  and second portion of the duodenum.  Recommendation:           - Return patient to hospital ward for ongoing care.                           - Clear liquid diet.                           - Continue present medications.                           - Await pathology results.                           - Perform a colonoscopy tomorrow.  ---------------------------------------------------------------------------------------------------------   Colonoscopy (01/06/2017)  Findings/recommendations ------------------------------------ - Colonoscopy today showed fair prep, multiple sigmoid diverticulosis, internal hemorrhoids but no evidence of active bleeding. - Okay to start anticoagulation/antiplatelet from GI standpoint. - Recommend repeat colonoscopy as previously  recommended by his primary gastroenterologist  because of fair prep today.  -----------------------------------------------------------------------------------------------------------   Left heart catheterization (01/08/2017)  Conclusion    Ost LAD lesion is 90% stenosed.  Previously placed Mid LAD stent (unknown type) is widely patent.  Ost Ramus lesion is 90% stenosed. This is a small vessel.  Ost RCA to Prox RCA lesion is 20% stenosed.  Previously placed Mid Cx stent (unknown type) is widely patent.  LV end diastolic pressure is normal.  There is no aortic valve stenosis.   Ostial LAD disease in a patient with recent GI bleeding while on Plavix, and new onset AFib which may require longterm anticoagulation.    COnsider surgical referral for LIMA to LAD, and possible surgical management of AFib including oversewing of Left atrial appendage and possibly Maze procedure.    Antiplatelet therapy plus anticoagulation would be high risk for bleeding long term.      Antimicrobials:  None    Subjective: Patient reports continued bowel movements without hematochezia/melena. No chest pain.  Objective: Vitals:     01/10/17 0430 01/10/17 0445 01/10/17 0509 01/10/17 0856  BP: 117/68 121/64  121/67  Pulse: 70 68    Resp: 18     Temp:   98.7 F (37.1 C) 97.9 F (36.6 C)  TempSrc:   Oral Oral  SpO2: 99% 98%    Weight:      Height:        Intake/Output Summary (Last 24 hours) at 01/10/2017 [id number] Last data filed at 01/10/2017  0600 Gross per 24 hour  Intake 738 ml  Output 1150 ml  Net -412 ml   Filed Weights   01/07/17 0530 01/08/17 0300 01/09/17 0438  Weight: 72.9 kg (160 lb 12.8 oz) 72.8 kg (160 lb 6.4 oz) 72.3 kg (159 lb 6.4 oz)    Examination:  General exam: Appears calm and comfortable Respiratory system: Respiratory effort normal. Gastrointestinal system: Abdomen is nondistended Central nervous system: Alert and oriented. No focal neurological deficits. Psychiatry: Judgement and insight appear normal. Mood & affect appropriate.     Data Reviewed: I have personally reviewed following labs and imaging studies  CBC: Recent Labs  Lab 01/04/17 0059  01/05/17 0206 01/06/17 0355 01/07/17 0815 01/08/17 0539 01/09/17 0517 01/10/17 0527  WBC 10.7*   < > 9.9 7.8 10.3 8.4 8.4 8.6  NEUTROABS 7.6  --  7.1 5.3 7.5  --   --   --   HGB 8.3*   < > 8.0* 7.9* 8.2* 8.2* 7.8* 8.3*  HCT 24.4*   < > 22.6* 22.7* 23.7* 23.8* 23.1* 24.7*  MCV 97.2   < > 99.1 100.0 99.2 99.6 100.9* 100.4*  PLT 126*   < > 120* 133* 183 189 212 245   < > = values in this interval not displayed.   Basic Metabolic Panel: Recent Labs  Lab 01/04/17 0059 01/05/17 0206 01/06/17 0355 01/07/17 0815 01/08/17 0539 01/09/17 0922 01/10/17 0527  NA 139 135 137 137 137 135 136  K 3.8 3.8 3.5 3.4* 3.3* 3.8 4.0  CL 115* 108 110 107 106 107 106  CO2 22 22 22 24 25 23 24   GLUCOSE 97 96 99 100* 93 117* 93  BUN 47* 21* 14 13 16 17 20   CREATININE 1.05 1.06 1.20 1.22 1.26* 1.20 1.24  CALCIUM 7.5* 8.0* 8.2* 8.5* 8.3* 8.2* 8.6*  MG 1.9 1.8  --   --   --  1.7  --    GFR: Estimated Creatinine Clearance: 53.1 mL/min (by C-G  formula based on SCr of 1.24 mg/dL). Liver Function Tests: Recent Labs  Lab 01/04/17 0059  AST 31  ALT 17  ALKPHOS 25*  BILITOT 1.4*  PROT 5.1*  ALBUMIN 2.7*   No results for input(s): LIPASE, AMYLASE in the last 168 hours. No results for input(s): AMMONIA in the last 168 hours. Coagulation Profile: Recent Labs  Lab 01/03/17 1030 01/08/17 0539  INR 1.27 1.12   Cardiac Enzymes: Recent Labs  Lab 01/03/17 1147 01/04/17 0058 01/04/17 0621 01/07/17 1247  TROPONINI 0.13* 2.07* 2.42* 2.84*   BNP (last 3 results) No results for input(s): PROBNP in the last 8760 hours. HbA1C: No results for input(s): HGBA1C in the last 72 hours. CBG: No results for input(s): GLUCAP in the last 168 hours. Lipid Profile: No results for input(s): CHOL, HDL, LDLCALC, TRIG, CHOLHDL, LDLDIRECT in the last 72 hours. Thyroid Function Tests: No results for input(s): TSH, T4TOTAL, FREET4, T3FREE, THYROIDAB in the last 72 hours. Anemia Panel: No results for input(s): VITAMINB12, FOLATE, FERRITIN, TIBC, IRON, RETICCTPCT in the last 72 hours. Sepsis Labs: No results for input(s): PROCALCITON, LATICACIDVEN in the last 168 hours.  Recent Results (from the past 240 hour(s))  Culture, blood (Routine X 2) w Reflex to ID Panel     Status: None   Collection Time: 01/02/17 12:35 PM  Result Value Ref Range Status   Specimen Description BLOOD RIGHT ARM  Final   Special Requests   Final    BOTTLES DRAWN AEROBIC AND ANAEROBIC  Blood Culture results may not be optimal due to an excessive volume of blood received in culture bottles   Culture NO GROWTH 5 DAYS  Final   Report Status 01/07/2017 FINAL  Final  Culture, blood (Routine X 2) w Reflex to ID Panel     Status: None   Collection Time: 01/02/17 12:50 PM  Result Value Ref Range Status   Specimen Description BLOOD LEFT ARM  Final   Special Requests   Final    BOTTLES DRAWN AEROBIC AND ANAEROBIC Blood Culture results may not be optimal due to an excessive  volume of blood received in culture bottles   Culture NO GROWTH 5 DAYS  Final   Report Status 01/07/2017 FINAL  Final  MRSA PCR Screening     Status: None   Collection Time: 01/03/17  1:13 PM  Result Value Ref Range Status   MRSA by PCR NEGATIVE NEGATIVE Final    Comment:        The GeneXpert MRSA Assay (FDA approved for NASAL specimens only), is one component of a comprehensive MRSA colonization surveillance program. It is not intended to diagnose MRSA infection nor to guide or monitor treatment for MRSA infections.          Radiology Studies: No results found.      Scheduled Meds: . amiodarone  400 mg Oral BID  . aspirin  81 mg Oral Daily  . atorvastatin  80 mg Oral Daily  . escitalopram  10 mg Oral Daily  . lidocaine  1 application Urethral Once  . pantoprazole  40 mg Oral BID  . senna  1 tablet Oral BID  . sodium chloride flush  3 mL Intravenous Q12H  . sodium chloride flush  3 mL Intravenous Q12H   Continuous Infusions: . sodium chloride Stopped (01/05/17 1222)  . sodium chloride    . sodium chloride    . heparin 1,200 Units/hr (01/09/17 2152)     LOS: 8 days     Jacquelin Hawking, MD [facility] 01/10/2017, 9:28 AM Pager: [phone]  If 7PM-7AM, please contact night-coverage [email or web address] Password [email or web address] 01/10/2017, 9:28 AM

## 2017-01-10 NOTE — Progress Notes (Addendum)
Progress Note  Patient Name: Stephen Crawford Date of Encounter: 01/10/2017  Primary Cardiologist: Dr. Turner Daniels  Subjective   Denies any chest pain or SOB  Inpatient Medications    Scheduled Meds: . amiodarone  400 mg Oral BID  . aspirin  81 mg Oral Daily  . atorvastatin  80 mg Oral Daily  . escitalopram  10 mg Oral Daily  . lidocaine  1 application Urethral Once  . pantoprazole  40 mg Oral BID  . senna  1 tablet Oral BID  . sodium chloride flush  3 mL Intravenous Q12H  . sodium chloride flush  3 mL Intravenous Q12H   Continuous Infusions: . sodium chloride Stopped (01/05/17 1222)  . sodium chloride    . sodium chloride    . heparin 1,200 Units/hr (01/09/17 2152)   PRN Meds: sodium chloride, sodium chloride, acetaminophen **OR** acetaminophen, acetaminophen, albuterol, alum & mag hydroxide-simeth, nitroGLYCERIN, ondansetron **OR** ondansetron (ZOFRAN) IV, polyethylene glycol, sodium chloride flush, sodium chloride flush   Vital Signs    Vitals:   01/10/17 0430 01/10/17 0445 01/10/17 0509 01/10/17 0856  BP: 117/68 121/64  121/67  Pulse: 70 68    Resp: 18     Temp:   98.7 F (37.1 C) 97.9 F (36.6 C)  TempSrc:   Oral Oral  SpO2: 99% 98%    Weight:      Height:        Intake/Output Summary (Last 24 hours) at 01/10/2017 1122 Last data filed at 01/10/2017 1053 Gross per 24 hour  Intake 258 ml  Output 1300 ml  Net -1042 ml   Filed Weights   01/07/17 0530 01/08/17 0300 01/09/17 0438  Weight: 160 lb 12.8 oz (72.9 kg) 160 lb 6.4 oz (72.8 kg) 159 lb 6.4 oz (72.3 kg)    Telemetry    NSR - Personally Reviewed  ECG    NSR with RBBB - Personally Reviewed  Physical Exam   GEN: No acute distress.   Neck: No JVD Cardiac: RRR, no murmurs, rubs, or gallops.  Respiratory: Clear to auscultation bilaterally. GI: Soft, nontender, non-distended  MS: No edema; No deformity. Neuro:  Nonfocal  Psych: Normal affect   Labs    Chemistry Recent Labs  Lab  01/04/17 0059  01/08/17 0539 01/09/17 0922 01/10/17 0527  NA 139   < > 137 135 136  K 3.8   < > 3.3* 3.8 4.0  CL 115*   < > 106 107 106  CO2 22   < > 25 23 24   GLUCOSE 97   < > 93 117* 93  BUN 47*   < > 16 17 20   CREATININE 1.05   < > 1.26* 1.20 1.24  CALCIUM 7.5*   < > 8.3* 8.2* 8.6*  PROT 5.1*  --   --   --   --   ALBUMIN 2.7*  --   --   --   --   AST 31  --   --   --   --   ALT 17  --   --   --   --   ALKPHOS 25*  --   --   --   --   BILITOT 1.4*  --   --   --   --   GFRNONAA >60   < > 55* 58* 56*  GFRAA >60   < > >60 >60 >60  ANIONGAP 2*   < > 6 5 6    < > = values  in this interval not displayed.     Hematology Recent Labs  Lab 01/08/17 0539 01/09/17 0517 01/10/17 0527  WBC 8.4 8.4 8.6  RBC 2.39* 2.29* 2.46*  HGB 8.2* 7.8* 8.3*  HCT 23.8* 23.1* 24.7*  MCV 99.6 100.9* 100.4*  MCH 34.3* 34.1* 33.7  MCHC 34.5 33.8 33.6  RDW 17.4* 17.1* 16.6*  PLT 189 212 245    Cardiac Enzymes Recent Labs  Lab 01/03/17 1147 01/04/17 0058 01/04/17 0621 01/07/17 1247  TROPONINI 0.13* 2.07* 2.42* 2.84*   No results for input(s): TROPIPOC in the last 168 hours.   BNPNo results for input(s): BNP, PROBNP in the last 168 hours.   DDimer No results for input(s): DDIMER in the last 168 hours.   Radiology    No results found.  Cardiac Studies   2D echo Study Conclusions  - Left ventricle: The cavity size was normal. Wall thickness was increased in a pattern of mild LVH. Systolic function was vigorous. The estimated ejection fraction was in the range of 65% to 70%. Wall motion was normal; there were no regional wall motion abnormalities. Doppler parameters are consistent with abnormal left ventricular relaxation (grade 1 diastolic dysfunction). - Aortic valve: Mildly calcified annulus. A bicuspid morphology cannot be excluded; mildly calcified leaflets. - Right atrium: Central venous pressure (est): 3 mm Hg. - Tricuspid valve: There was trivial  regurgitation. - Pulmonary arteries: PA peak pressure: 29 mm Hg (S). - Pericardium, extracardiac: There was no pericardial effusion.  Impressions:  - Mild LVH with LVEF 65-70% and grade 1 diastolic dysfunction. Mildly calcified aortic annulus, cannot exclude bicuspid morphology with mildly calcified leaflets. Trivial tricuspid regurgitation with estimated PASP 29 mmHg.  Cardiac Cath 01/08/2017 Conclusion     Ost LAD lesion is 90% stenosed.  Previously placed Mid LAD stent (unknown type) is widely patent.  Ost Ramus lesion is 90% stenosed. This is a small vessel.  Ost RCA to Prox RCA lesion is 20% stenosed.  Previously placed Mid Cx stent (unknown type) is widely patent.  LV end diastolic pressure is normal.  There is no aortic valve stenosis.   Ostial LAD disease in a patient with recent GI bleeding while on Plavix, and new onset AFib which may require longterm anticoagulation.    COnsider surgical referral for LIMA to LAD, and possible surgical management of AFib including oversewing of Left atrial appendage and possibly Maze procedure.    Antiplatelet therapy plus anticoagulation would be high risk for bleeding long term.       Patient Profile      71 y.o.malewith history of CAD admitted with new onset afib and syncope, now with progressive anemia concerning for GI bleed and chest pain with elevated troponin consistent with NSTEMI.  Assessment & Plan    1.  New onset atrial fibrillation with RVR  - plan for MAZE and LAA clipping at time of CABG tomorrow  2.  Syncope - likely secondary to afib with RVR - 2D echo with no significant valvular heart disease  3.  NSTEMI/ASCAD - significant 90% ostial LAD disease with high risk lesion and 90% ost ramus with recent GIB is at high risk of recurrent bleed on DAPT if PCI were attempted.   - plan for CABG on Monday - continue ASA, high dose statin, IV Heparin gtt  4.  Hyperlipidemia with LDL goal < 70  -  LDL at goal at 41 - continue high dose statin   For questions or updates, please contact [facility] Please  consult [email or web address] for contact info under Cardiology/STEMI.      Signed, Armanda Magicraci Turner, MD  01/10/2017, 11:22 AM

## 2017-01-11 ENCOUNTER — Encounter (HOSPITAL_COMMUNITY): Payer: Self-pay | Admitting: Certified Registered Nurse Anesthetist

## 2017-01-11 ENCOUNTER — Inpatient Hospital Stay (HOSPITAL_COMMUNITY)
Admission: EM | Disposition: A | Discharge status: Home / Self Care | Payer: Self-pay | Source: Home / Self Care | Attending: Family Medicine

## 2017-01-11 ENCOUNTER — Inpatient Hospital Stay (HOSPITAL_COMMUNITY): Payer: Medicare HMO

## 2017-01-11 ENCOUNTER — Inpatient Hospital Stay (HOSPITAL_COMMUNITY): Payer: Medicare HMO | Admitting: Certified Registered Nurse Anesthetist

## 2017-01-11 DIAGNOSIS — Z951 Presence of aortocoronary bypass graft: Secondary | ICD-10-CM

## 2017-01-11 HISTORY — PX: MAZE: SHX5063

## 2017-01-11 HISTORY — PX: CORONARY ARTERY BYPASS GRAFT: SHX141

## 2017-01-11 HISTORY — PX: TEE WITHOUT CARDIOVERSION: SHX5443

## 2017-01-11 HISTORY — PX: CLIPPING OF ATRIAL APPENDAGE: SHX5773

## 2017-01-11 LAB — GLUCOSE, CAPILLARY
GLUCOSE-CAPILLARY: 98 mg/dL (ref 65–99)
GLUCOSE-CAPILLARY: 110 mg/dL — AB (ref 65–99)
Glucose-Capillary: 101 mg/dL — ABNORMAL HIGH (ref 65–99)

## 2017-01-11 LAB — POCT I-STAT 3, ART BLOOD GAS (G3+)
ACID-BASE DEFICIT: 2 mmol/L (ref 0.0–2.0)
ACID-BASE DEFICIT: 3 mmol/L — AB (ref 0.0–2.0)
ACID-BASE DEFICIT: 4 mmol/L — AB (ref 0.0–2.0)
ACID-BASE DEFICIT: 2 mmol/L (ref 0.0–2.0)
ACID-BASE EXCESS: 2 mmol/L (ref 0.0–2.0)
BICARBONATE: 26.7 mmol/L (ref 20.0–28.0)
BICARBONATE: 22.2 mmol/L (ref 20.0–28.0)
Bicarbonate: 24.5 mmol/L (ref 20.0–28.0)
Bicarbonate: 22.8 mmol/L (ref 20.0–28.0)
Bicarbonate: 22.9 mmol/L (ref 20.0–28.0)
O2 SAT: 100 %
O2 SAT: 99 %
O2 SAT: 96 %
O2 SAT: 99 %
O2 Saturation: 100 %
PCO2 ART: 46.9 mmHg (ref 32.0–48.0)
PCO2 ART: 39.5 mmHg (ref 32.0–48.0)
PH ART: 7.324 — AB (ref 7.350–7.450)
PH ART: 7.346 — AB (ref 7.350–7.450)
PH ART: 7.287 — AB (ref 7.350–7.450)
PO2 ART: 329 mmHg — AB (ref 83.0–108.0)
PO2 ART: 92 mmHg (ref 83.0–108.0)
PO2 ART: 116 mmHg — AB (ref 83.0–108.0)
Patient temperature: 36.5
Patient temperature: 36.6
Patient temperature: 36.5
TCO2: 28 mmol/L (ref 22–32)
TCO2: 26 mmol/L (ref 22–32)
TCO2: 24 mmol/L (ref 22–32)
TCO2: 24 mmol/L (ref 22–32)
TCO2: 24 mmol/L (ref 22–32)
pCO2 arterial: 42.2 mmHg (ref 32.0–48.0)
pCO2 arterial: 41.5 mmHg (ref 32.0–48.0)
pCO2 arterial: 46.4 mmHg (ref 32.0–48.0)
pH, Arterial: 7.408 (ref 7.350–7.450)
pH, Arterial: 7.369 (ref 7.350–7.450)
pO2, Arterial: 183 mmHg — ABNORMAL HIGH (ref 83.0–108.0)
pO2, Arterial: 138 mmHg — ABNORMAL HIGH (ref 83.0–108.0)

## 2017-01-11 LAB — POCT I-STAT, CHEM 8
BUN: 16 mg/dL (ref 6–20)
BUN: 15 mg/dL (ref 6–20)
BUN: 14 mg/dL (ref 6–20)
BUN: 15 mg/dL (ref 6–20)
BUN: 15 mg/dL (ref 6–20)
BUN: 15 mg/dL (ref 6–20)
BUN: 15 mg/dL (ref 6–20)
CALCIUM ION: 1.21 mmol/L (ref 1.15–1.40)
CALCIUM ION: 1.09 mmol/L — AB (ref 1.15–1.40)
CALCIUM ION: 1.17 mmol/L (ref 1.15–1.40)
CHLORIDE: 102 mmol/L (ref 101–111)
CHLORIDE: 104 mmol/L (ref 101–111)
CHLORIDE: 104 mmol/L (ref 101–111)
CHLORIDE: 104 mmol/L (ref 101–111)
CHLORIDE: 107 mmol/L (ref 101–111)
CREATININE: 1 mg/dL (ref 0.61–1.24)
CREATININE: 1 mg/dL (ref 0.61–1.24)
CREATININE: 0.9 mg/dL (ref 0.61–1.24)
CREATININE: 1 mg/dL (ref 0.61–1.24)
CREATININE: 1 mg/dL (ref 0.61–1.24)
CREATININE: 1 mg/dL (ref 0.61–1.24)
Calcium, Ion: 1.23 mmol/L (ref 1.15–1.40)
Calcium, Ion: 1.07 mmol/L — ABNORMAL LOW (ref 1.15–1.40)
Calcium, Ion: 1.04 mmol/L — ABNORMAL LOW (ref 1.15–1.40)
Calcium, Ion: 1.15 mmol/L (ref 1.15–1.40)
Chloride: 103 mmol/L (ref 101–111)
Chloride: 104 mmol/L (ref 101–111)
Creatinine, Ser: 1.1 mg/dL (ref 0.61–1.24)
GLUCOSE: 114 mg/dL — AB (ref 65–99)
GLUCOSE: 128 mg/dL — AB (ref 65–99)
GLUCOSE: 112 mg/dL — AB (ref 65–99)
Glucose, Bld: 105 mg/dL — ABNORMAL HIGH (ref 65–99)
Glucose, Bld: 100 mg/dL — ABNORMAL HIGH (ref 65–99)
Glucose, Bld: 116 mg/dL — ABNORMAL HIGH (ref 65–99)
Glucose, Bld: 135 mg/dL — ABNORMAL HIGH (ref 65–99)
HCT: 23 % — ABNORMAL LOW (ref 39.0–52.0)
HCT: 24 % — ABNORMAL LOW (ref 39.0–52.0)
HEMATOCRIT: 20 % — AB (ref 39.0–52.0)
HEMATOCRIT: 21 % — AB (ref 39.0–52.0)
HEMATOCRIT: 21 % — AB (ref 39.0–52.0)
HEMATOCRIT: 21 % — AB (ref 39.0–52.0)
HEMATOCRIT: 26 % — AB (ref 39.0–52.0)
HEMOGLOBIN: 7.1 g/dL — AB (ref 13.0–17.0)
Hemoglobin: 7.8 g/dL — ABNORMAL LOW (ref 13.0–17.0)
Hemoglobin: 6.8 g/dL — CL (ref 13.0–17.0)
Hemoglobin: 7.1 g/dL — ABNORMAL LOW (ref 13.0–17.0)
Hemoglobin: 7.1 g/dL — ABNORMAL LOW (ref 13.0–17.0)
Hemoglobin: 8.2 g/dL — ABNORMAL LOW (ref 13.0–17.0)
Hemoglobin: 8.8 g/dL — ABNORMAL LOW (ref 13.0–17.0)
POTASSIUM: 4.1 mmol/L (ref 3.5–5.1)
POTASSIUM: 5.3 mmol/L — AB (ref 3.5–5.1)
POTASSIUM: 5 mmol/L (ref 3.5–5.1)
POTASSIUM: 4.7 mmol/L (ref 3.5–5.1)
Potassium: 4.1 mmol/L (ref 3.5–5.1)
Potassium: 4.6 mmol/L (ref 3.5–5.1)
Potassium: 5.4 mmol/L — ABNORMAL HIGH (ref 3.5–5.1)
SODIUM: 138 mmol/L (ref 135–145)
SODIUM: 138 mmol/L (ref 135–145)
SODIUM: 136 mmol/L (ref 135–145)
Sodium: 139 mmol/L (ref 135–145)
Sodium: 136 mmol/L (ref 135–145)
Sodium: 137 mmol/L (ref 135–145)
Sodium: 138 mmol/L (ref 135–145)
TCO2: 26 mmol/L (ref 22–32)
TCO2: 27 mmol/L (ref 22–32)
TCO2: 26 mmol/L (ref 22–32)
TCO2: 24 mmol/L (ref 22–32)
TCO2: 25 mmol/L (ref 22–32)
TCO2: 26 mmol/L (ref 22–32)
TCO2: 22 mmol/L (ref 22–32)

## 2017-01-11 LAB — CBC
HCT: 30.8 % — ABNORMAL LOW (ref 39.0–52.0)
HCT: 27.2 % — ABNORMAL LOW (ref 39.0–52.0)
HEMATOCRIT: 24.2 % — AB (ref 39.0–52.0)
HEMOGLOBIN: 8.1 g/dL — AB (ref 13.0–17.0)
HEMOGLOBIN: 10.5 g/dL — AB (ref 13.0–17.0)
HEMOGLOBIN: 9.1 g/dL — AB (ref 13.0–17.0)
MCH: 33.3 pg (ref 26.0–34.0)
MCH: 32.3 pg (ref 26.0–34.0)
MCH: 31.7 pg (ref 26.0–34.0)
MCHC: 33.5 g/dL (ref 30.0–36.0)
MCHC: 34.1 g/dL (ref 30.0–36.0)
MCHC: 33.5 g/dL (ref 30.0–36.0)
MCV: 99.6 fL (ref 78.0–100.0)
MCV: 94.8 fL (ref 78.0–100.0)
MCV: 94.8 fL (ref 78.0–100.0)
Platelets: 272 10*3/uL (ref 150–400)
Platelets: 166 10*3/uL (ref 150–400)
Platelets: 144 10*3/uL — ABNORMAL LOW (ref 150–400)
RBC: 2.43 MIL/uL — AB (ref 4.22–5.81)
RBC: 3.25 MIL/uL — ABNORMAL LOW (ref 4.22–5.81)
RBC: 2.87 MIL/uL — AB (ref 4.22–5.81)
RDW: 16.3 % — ABNORMAL HIGH (ref 11.5–15.5)
RDW: 17.2 % — AB (ref 11.5–15.5)
RDW: 17.7 % — ABNORMAL HIGH (ref 11.5–15.5)
WBC: 8.5 10*3/uL (ref 4.0–10.5)
WBC: 19.3 10*3/uL — ABNORMAL HIGH (ref 4.0–10.5)
WBC: 15.7 10*3/uL — AB (ref 4.0–10.5)

## 2017-01-11 LAB — HEPARIN LEVEL (UNFRACTIONATED): HEPARIN UNFRACTIONATED: 0.51 [IU]/mL (ref 0.30–0.70)

## 2017-01-11 LAB — BASIC METABOLIC PANEL
Anion gap: 6 (ref 5–15)
BUN: 16 mg/dL (ref 6–20)
CHLORIDE: 107 mmol/L (ref 101–111)
CO2: 23 mmol/L (ref 22–32)
Calcium: 8.3 mg/dL — ABNORMAL LOW (ref 8.9–10.3)
Creatinine, Ser: 1.21 mg/dL (ref 0.61–1.24)
GFR calc Af Amer: >60 mL/min (ref >60)
GFR calc non Af Amer: 58 mL/min — ABNORMAL LOW (ref >60)
Glucose, Bld: 89 mg/dL (ref 65–99)
Potassium: 3.8 mmol/L (ref 3.5–5.1)
SODIUM: 136 mmol/L (ref 135–145)

## 2017-01-11 LAB — POCT I-STAT 4, (NA,K, GLUC, HGB,HCT)
GLUCOSE: 101 mg/dL — AB (ref 65–99)
HCT: 28 % — ABNORMAL LOW (ref 39.0–52.0)
HEMOGLOBIN: 9.5 g/dL — AB (ref 13.0–17.0)
POTASSIUM: 4.5 mmol/L (ref 3.5–5.1)
Sodium: 140 mmol/L (ref 135–145)

## 2017-01-11 LAB — CREATININE, SERUM
Creatinine, Ser: 1.18 mg/dL (ref 0.61–1.24)
GFR, EST NON AFRICAN AMERICAN: 59 mL/min — AB (ref >60)

## 2017-01-11 LAB — PREPARE RBC (CROSSMATCH)

## 2017-01-11 LAB — HEMOGLOBIN AND HEMATOCRIT, BLOOD
HEMATOCRIT: 21.8 % — AB (ref 39.0–52.0)
HEMOGLOBIN: 7.4 g/dL — AB (ref 13.0–17.0)

## 2017-01-11 LAB — PLATELET COUNT: PLATELETS: 146 10*3/uL — AB (ref 150–400)

## 2017-01-11 LAB — MAGNESIUM: Magnesium: 2.9 mg/dL — ABNORMAL HIGH (ref 1.7–2.4)

## 2017-01-11 LAB — PROTIME-INR
INR: 1.37
PROTHROMBIN TIME: 16.7 s — AB (ref 11.4–15.2)

## 2017-01-11 LAB — APTT: aPTT: 34 seconds (ref 24–36)

## 2017-01-11 SURGERY — CORONARY ARTERY BYPASS GRAFTING (CABG)
Anesthesia: General | Site: Chest

## 2017-01-11 MED ORDER — NOREPINEPHRINE BITARTRATE 1 MG/ML IV SOLN
0.0000 ug/min | INTRAVENOUS | Status: DC
  Administered 2017-01-11: 2 ug/min via INTRAVENOUS
  Filled 2017-01-11: qty 4

## 2017-01-11 MED ORDER — POTASSIUM CHLORIDE 10 MEQ/50ML IV SOLN: 10.0000 meq | INTRAVENOUS | Status: AC

## 2017-01-11 MED ORDER — MIDAZOLAM HCL 2 MG/2ML IJ SOLN
INTRAMUSCULAR | Status: AC
  Filled 2017-01-11: qty 2

## 2017-01-11 MED ORDER — ARTIFICIAL TEARS OPHTHALMIC OINT
TOPICAL_OINTMENT | OPHTHALMIC | Status: DC | PRN
  Administered 2017-01-11: 1 via OPHTHALMIC

## 2017-01-11 MED ORDER — LIDOCAINE 2% (20 MG/ML) 5 ML SYRINGE
INTRAMUSCULAR | Status: DC | PRN
  Administered 2017-01-11: 50 mg via INTRAVENOUS

## 2017-01-11 MED ORDER — EPHEDRINE 5 MG/ML INJ
INTRAVENOUS | Status: AC
  Filled 2017-01-11: qty 10

## 2017-01-11 MED ORDER — NOREPINEPHRINE BITARTRATE 1 MG/ML IV SOLN: 0.0000 ug/min | INTRAVENOUS | Status: DC

## 2017-01-11 MED ORDER — METOPROLOL TARTRATE 25 MG/10 ML ORAL SUSPENSION: 12.5000 mg | Freq: Two times a day (BID) | ORAL | Status: DC

## 2017-01-11 MED ORDER — LACTATED RINGERS IV SOLN: 500.0000 mL | Freq: Once | INTRAVENOUS | Status: DC | PRN

## 2017-01-11 MED ORDER — FENTANYL CITRATE (PF) 250 MCG/5ML IJ SOLN
INTRAMUSCULAR | Status: AC
  Filled 2017-01-11: qty 25

## 2017-01-11 MED ORDER — MORPHINE SULFATE (PF) 2 MG/ML IV SOLN: 2.0000 mg | INTRAVENOUS | Status: DC | PRN

## 2017-01-11 MED ORDER — FAMOTIDINE IN NACL 20-0.9 MG/50ML-% IV SOLN
20.0000 mg | Freq: Two times a day (BID) | INTRAVENOUS | Status: DC
  Administered 2017-01-11: 20 mg via INTRAVENOUS
  Filled 2017-01-11: qty 50

## 2017-01-11 MED ORDER — SODIUM CHLORIDE 0.9 % IV SOLN
0.0000 ug/kg/h | INTRAVENOUS | Status: DC
  Filled 2017-01-11: qty 2

## 2017-01-11 MED ORDER — ACETAMINOPHEN 160 MG/5ML PO SOLN
1000.0000 mg | Freq: Four times a day (QID) | ORAL | Status: DC
Start: 2017-01-12 — End: 2017-01-15

## 2017-01-11 MED ORDER — LACTATED RINGERS IV SOLN: INTRAVENOUS | Status: DC

## 2017-01-11 MED ORDER — ACETAMINOPHEN 500 MG PO TABS
1000.0000 mg | ORAL_TABLET | Freq: Four times a day (QID) | ORAL | Status: DC
  Administered 2017-01-12 – 2017-01-15 (×13): 1000 mg via ORAL
  Filled 2017-01-11 (×13): qty 2

## 2017-01-11 MED ORDER — FENTANYL CITRATE (PF) 100 MCG/2ML IJ SOLN
INTRAMUSCULAR | Status: DC | PRN
  Administered 2017-01-11: 50 ug via INTRAVENOUS
  Administered 2017-01-11: 250 ug via INTRAVENOUS
  Administered 2017-01-11: 100 ug via INTRAVENOUS
  Administered 2017-01-11 (×3): 250 ug via INTRAVENOUS
  Administered 2017-01-11: 100 ug via INTRAVENOUS

## 2017-01-11 MED ORDER — ACETAMINOPHEN 650 MG RE SUPP
650.0000 mg | Freq: Once | RECTAL | Status: AC
  Administered 2017-01-11: 650 mg via RECTAL

## 2017-01-11 MED ORDER — SUCCINYLCHOLINE CHLORIDE 200 MG/10ML IV SOSY
PREFILLED_SYRINGE | INTRAVENOUS | Status: AC
  Filled 2017-01-11: qty 10

## 2017-01-11 MED ORDER — CEFUROXIME SODIUM 1.5 G IV SOLR
1.5000 g | Freq: Two times a day (BID) | INTRAVENOUS | Status: AC
  Administered 2017-01-11 – 2017-01-13 (×4): 1.5 g via INTRAVENOUS
  Filled 2017-01-11 (×4): qty 1.5

## 2017-01-11 MED ORDER — MORPHINE SULFATE (PF) 2 MG/ML IV SOLN: 1.0000 mg | INTRAVENOUS | Status: DC | PRN

## 2017-01-11 MED ORDER — SODIUM CHLORIDE 0.9 % IV SOLN
INTRAVENOUS | Status: DC | PRN
  Administered 2017-01-11: 12:00:00 via INTRAVENOUS

## 2017-01-11 MED ORDER — 0.9 % SODIUM CHLORIDE (POUR BTL) OPTIME
TOPICAL | Status: DC | PRN
  Administered 2017-01-11: 6000 mL

## 2017-01-11 MED ORDER — HEMOSTATIC AGENTS (NO CHARGE) OPTIME
TOPICAL | Status: DC | PRN
  Administered 2017-01-11: 1 via TOPICAL

## 2017-01-11 MED ORDER — MORPHINE SULFATE (PF) 4 MG/ML IV SOLN: 1.0000 mg | INTRAVENOUS | Status: AC | PRN

## 2017-01-11 MED ORDER — PROTAMINE SULFATE 10 MG/ML IV SOLN
INTRAVENOUS | Status: AC
  Filled 2017-01-11: qty 25

## 2017-01-11 MED ORDER — NOREPINEPHRINE BITARTRATE 1 MG/ML IV SOLN
0.0000 ug/min | INTRAVENOUS | Status: AC
  Administered 2017-01-11: 2 ug/min via INTRAVENOUS
  Filled 2017-01-11: qty 4

## 2017-01-11 MED ORDER — LIDOCAINE 2% (20 MG/ML) 5 ML SYRINGE
INTRAMUSCULAR | Status: AC
  Filled 2017-01-11: qty 5

## 2017-01-11 MED ORDER — MIDAZOLAM HCL 10 MG/2ML IJ SOLN
INTRAMUSCULAR | Status: AC
  Filled 2017-01-11: qty 2

## 2017-01-11 MED ORDER — INSULIN REGULAR BOLUS VIA INFUSION
0.0000 [IU] | Freq: Three times a day (TID) | INTRAVENOUS | Status: DC
  Filled 2017-01-11: qty 10

## 2017-01-11 MED ORDER — SODIUM CHLORIDE 0.9 % IV SOLN
0.0000 ug/min | INTRAVENOUS | Status: DC
  Filled 2017-01-11: qty 2

## 2017-01-11 MED ORDER — ALBUMIN HUMAN 5 % IV SOLN
250.0000 mL | INTRAVENOUS | Status: AC | PRN
  Administered 2017-01-11 (×4): 250 mL via INTRAVENOUS
  Filled 2017-01-11 (×2): qty 250

## 2017-01-11 MED ORDER — METOPROLOL TARTRATE 12.5 MG HALF TABLET: 12.5000 mg | ORAL_TABLET | Freq: Two times a day (BID) | ORAL | Status: DC

## 2017-01-11 MED ORDER — HEPARIN SODIUM (PORCINE) 1000 UNIT/ML IJ SOLN
INTRAMUSCULAR | Status: AC
  Filled 2017-01-11: qty 1

## 2017-01-11 MED ORDER — ASPIRIN EC 325 MG PO TBEC
325.0000 mg | DELAYED_RELEASE_TABLET | Freq: Every day | ORAL | Status: DC
  Administered 2017-01-12: 325 mg via ORAL
  Filled 2017-01-11: qty 1

## 2017-01-11 MED ORDER — SODIUM CHLORIDE 0.9% FLUSH: 3.0000 mL | INTRAVENOUS | Status: DC | PRN

## 2017-01-11 MED ORDER — SODIUM CHLORIDE 0.45 % IV SOLN
INTRAVENOUS | Status: DC | PRN
  Administered 2017-01-11: 20 mL via INTRAVENOUS

## 2017-01-11 MED ORDER — THROMBIN (RECOMBINANT) 5000 UNITS EX SOLR
OROMUCOSAL | Status: DC | PRN
  Administered 2017-01-11 (×3): 4 mL via TOPICAL

## 2017-01-11 MED ORDER — ROCURONIUM BROMIDE 10 MG/ML (PF) SYRINGE
PREFILLED_SYRINGE | INTRAVENOUS | Status: AC
  Filled 2017-01-11: qty 5

## 2017-01-11 MED ORDER — BISACODYL 10 MG RE SUPP: 10.0000 mg | Freq: Every day | RECTAL | Status: DC

## 2017-01-11 MED ORDER — OXYCODONE HCL 5 MG PO TABS
5.0000 mg | ORAL_TABLET | ORAL | Status: DC | PRN
  Administered 2017-01-13 – 2017-01-14 (×3): 5 mg via ORAL
  Filled 2017-01-11 (×3): qty 1

## 2017-01-11 MED ORDER — PHENYLEPHRINE HCL 10 MG/ML IJ SOLN
INTRAVENOUS | Status: DC | PRN
  Administered 2017-01-11: 50 ug/min via INTRAVENOUS

## 2017-01-11 MED ORDER — ASPIRIN 81 MG PO CHEW: 324.0000 mg | CHEWABLE_TABLET | Freq: Every day | ORAL | Status: DC

## 2017-01-11 MED ORDER — SODIUM CHLORIDE 0.9% FLUSH
3.0000 mL | Freq: Two times a day (BID) | INTRAVENOUS | Status: DC
  Administered 2017-01-12 – 2017-01-15 (×6): 3 mL via INTRAVENOUS

## 2017-01-11 MED ORDER — MAGNESIUM SULFATE 4 GM/100ML IV SOLN
4.0000 g | Freq: Once | INTRAVENOUS | Status: AC
  Administered 2017-01-11: 4 g via INTRAVENOUS
  Filled 2017-01-11: qty 100

## 2017-01-11 MED ORDER — DOCUSATE SODIUM 100 MG PO CAPS
200.0000 mg | ORAL_CAPSULE | Freq: Every day | ORAL | Status: DC
  Administered 2017-01-12 – 2017-01-15 (×3): 200 mg via ORAL
  Filled 2017-01-11 (×3): qty 2

## 2017-01-11 MED ORDER — ALBUMIN HUMAN 5 % IV SOLN
12.5000 g | Freq: Once | INTRAVENOUS | Status: AC
  Administered 2017-01-11: 12.5 g via INTRAVENOUS

## 2017-01-11 MED ORDER — SODIUM CHLORIDE 0.9 % IV SOLN: Freq: Once | INTRAVENOUS | Status: DC

## 2017-01-11 MED ORDER — ARTIFICIAL TEARS OPHTHALMIC OINT
TOPICAL_OINTMENT | OPHTHALMIC | Status: AC
  Filled 2017-01-11: qty 3.5

## 2017-01-11 MED ORDER — MORPHINE SULFATE (PF) 4 MG/ML IV SOLN
2.0000 mg | INTRAVENOUS | Status: DC | PRN
  Administered 2017-01-13: 4 mg via INTRAVENOUS
  Filled 2017-01-11: qty 1

## 2017-01-11 MED ORDER — MIDAZOLAM HCL 2 MG/2ML IJ SOLN: 2.0000 mg | INTRAMUSCULAR | Status: DC | PRN

## 2017-01-11 MED ORDER — ALBUMIN HUMAN 5 % IV SOLN
INTRAVENOUS | Status: DC | PRN
  Administered 2017-01-11: 12:00:00 via INTRAVENOUS

## 2017-01-11 MED ORDER — PHENYLEPHRINE 40 MCG/ML (10ML) SYRINGE FOR IV PUSH (FOR BLOOD PRESSURE SUPPORT)
PREFILLED_SYRINGE | INTRAVENOUS | Status: AC
  Filled 2017-01-11: qty 10

## 2017-01-11 MED ORDER — NITROGLYCERIN IN D5W 200-5 MCG/ML-% IV SOLN: 0.0000 ug/min | INTRAVENOUS | Status: DC

## 2017-01-11 MED ORDER — LACTATED RINGERS IV SOLN
INTRAVENOUS | Status: DC | PRN
  Administered 2017-01-11 (×2): via INTRAVENOUS

## 2017-01-11 MED ORDER — PROTAMINE SULFATE 10 MG/ML IV SOLN
INTRAVENOUS | Status: DC | PRN
  Administered 2017-01-11: 50 mg via INTRAVENOUS
  Administered 2017-01-11: 170 mg via INTRAVENOUS

## 2017-01-11 MED ORDER — ROCURONIUM BROMIDE 10 MG/ML (PF) SYRINGE
PREFILLED_SYRINGE | INTRAVENOUS | Status: DC | PRN
  Administered 2017-01-11 (×4): 50 mg via INTRAVENOUS

## 2017-01-11 MED ORDER — PROPOFOL 10 MG/ML IV BOLUS
INTRAVENOUS | Status: AC
  Filled 2017-01-11: qty 20

## 2017-01-11 MED ORDER — CALCIUM CHLORIDE 10 % IV SOLN
INTRAVENOUS | Status: AC
  Filled 2017-01-11: qty 10

## 2017-01-11 MED ORDER — BISACODYL 5 MG PO TBEC
10.0000 mg | DELAYED_RELEASE_TABLET | Freq: Every day | ORAL | Status: DC
  Administered 2017-01-12 – 2017-01-13 (×2): 10 mg via ORAL
  Filled 2017-01-11 (×2): qty 2

## 2017-01-11 MED ORDER — SODIUM CHLORIDE 0.9 % IV SOLN
INTRAVENOUS | Status: DC
  Filled 2017-01-11: qty 1

## 2017-01-11 MED ORDER — THROMBIN 5000 UNITS EX SOLR
CUTANEOUS | Status: DC | PRN
  Administered 2017-01-11 (×3): 5000 [IU] via TOPICAL

## 2017-01-11 MED ORDER — CALCIUM CHLORIDE 10 % IV SOLN
INTRAVENOUS | Status: DC | PRN
  Administered 2017-01-11: 400 mg via INTRAVENOUS

## 2017-01-11 MED ORDER — ORAL CARE MOUTH RINSE
15.0000 mL | Freq: Two times a day (BID) | OROMUCOSAL | Status: DC
  Administered 2017-01-12 (×2): 15 mL via OROMUCOSAL

## 2017-01-11 MED ORDER — MIDAZOLAM HCL 5 MG/5ML IJ SOLN
INTRAMUSCULAR | Status: DC | PRN
  Administered 2017-01-11: 1 mg via INTRAVENOUS
  Administered 2017-01-11 (×4): 2 mg via INTRAVENOUS
  Administered 2017-01-11: 5 mg via INTRAVENOUS

## 2017-01-11 MED ORDER — VANCOMYCIN HCL IN DEXTROSE 1-5 GM/200ML-% IV SOLN
1000.0000 mg | Freq: Once | INTRAVENOUS | Status: AC
  Administered 2017-01-11: 1000 mg via INTRAVENOUS
  Filled 2017-01-11: qty 200

## 2017-01-11 MED ORDER — INSULIN ASPART 100 UNIT/ML ~~LOC~~ SOLN
0.0000 [IU] | SUBCUTANEOUS | Status: DC
  Administered 2017-01-11 – 2017-01-12 (×2): 2 [IU] via SUBCUTANEOUS

## 2017-01-11 MED ORDER — ONDANSETRON HCL 4 MG/2ML IJ SOLN: 4.0000 mg | Freq: Four times a day (QID) | INTRAMUSCULAR | Status: DC | PRN

## 2017-01-11 MED ORDER — LACTATED RINGERS IV SOLN
INTRAVENOUS | Status: DC | PRN
  Administered 2017-01-11: 07:00:00 via INTRAVENOUS

## 2017-01-11 MED ORDER — CHLORHEXIDINE GLUCONATE 0.12 % MT SOLN
15.0000 mL | OROMUCOSAL | Status: AC
  Administered 2017-01-11: 15 mL via OROMUCOSAL

## 2017-01-11 MED ORDER — SODIUM CHLORIDE 0.9 % IV SOLN
INTRAVENOUS | Status: DC
  Administered 2017-01-12: 20:00:00 via INTRAVENOUS

## 2017-01-11 MED ORDER — METOPROLOL TARTRATE 5 MG/5ML IV SOLN: 2.5000 mg | INTRAVENOUS | Status: DC | PRN

## 2017-01-11 MED ORDER — ACETAMINOPHEN 160 MG/5ML PO SOLN: 650.0000 mg | Freq: Once | ORAL | Status: AC

## 2017-01-11 MED ORDER — HEPARIN SODIUM (PORCINE) 1000 UNIT/ML IJ SOLN
INTRAMUSCULAR | Status: DC | PRN
  Administered 2017-01-11: 21000 [IU] via INTRAVENOUS

## 2017-01-11 MED ORDER — SODIUM CHLORIDE 0.9 % IV SOLN: 250.0000 mL | INTRAVENOUS | Status: DC

## 2017-01-11 MED ORDER — PANTOPRAZOLE SODIUM 40 MG PO TBEC
40.0000 mg | DELAYED_RELEASE_TABLET | Freq: Every day | ORAL | Status: DC
  Administered 2017-01-13 – 2017-01-15 (×3): 40 mg via ORAL
  Filled 2017-01-11 (×3): qty 1

## 2017-01-11 MED ORDER — PROPOFOL 10 MG/ML IV BOLUS
INTRAVENOUS | Status: DC | PRN
  Administered 2017-01-11: 70 mg via INTRAVENOUS

## 2017-01-11 MED ORDER — TRAMADOL HCL 50 MG PO TABS
50.0000 mg | ORAL_TABLET | ORAL | Status: DC | PRN
  Administered 2017-01-12: 100 mg via ORAL
  Filled 2017-01-11: qty 2

## 2017-01-11 SURGICAL SUPPLY — 127 items
ARTICLIP LAA PROCLIP II 45 (Clip) ×4 IMPLANT
BAG DECANTER FOR FLEXI CONT (MISCELLANEOUS) ×4 IMPLANT
BANDAGE ACE 4X5 VEL STRL LF (GAUZE/BANDAGES/DRESSINGS) IMPLANT
BANDAGE ACE 6X5 VEL STRL LF (GAUZE/BANDAGES/DRESSINGS) IMPLANT
BASKET HEART (ORDER IN 25'S) (MISCELLANEOUS) ×1
BASKET HEART (ORDER IN 25S) (MISCELLANEOUS) ×3 IMPLANT
BLADE STERNUM SYSTEM 6 (BLADE) ×4 IMPLANT
BNDG GAUZE ELAST 4 BULKY (GAUZE/BANDAGES/DRESSINGS) IMPLANT
CANISTER SUCT 3000ML PPV (MISCELLANEOUS) ×4 IMPLANT
CANN PRFSN 3/8X14X24FR PCFC (MISCELLANEOUS) ×3
CANNULA PRFSN 3/8X14X24FR PCFC (MISCELLANEOUS) ×3 IMPLANT
CANNULA SUMP PERICARDIAL (CANNULA) ×4 IMPLANT
CANNULA VEN MTL TIP RT (MISCELLANEOUS) ×1
CANNULA VRC MALB SNGL STG 34FR (MISCELLANEOUS) ×3 IMPLANT
CARDIOBLATE CARDIAC ABLATION (MISCELLANEOUS)
CATH ROBINSON RED A/P 18FR (CATHETERS) ×12 IMPLANT
CATH THORACIC 28FR (CATHETERS) ×8 IMPLANT
CATH THORACIC 36FR (CATHETERS) ×4 IMPLANT
CATH THORACIC 36FR RT ANG (CATHETERS) ×4 IMPLANT
CLAMP OLL ABLATION (MISCELLANEOUS) ×4 IMPLANT
CLIP VESOCCLUDE MED 24/CT (CLIP) IMPLANT
CLIP VESOCCLUDE SM WIDE 24/CT (CLIP) ×4 IMPLANT
CONN 1/2X1/2X1/2  BEN (MISCELLANEOUS) ×1
CONN 1/2X1/2X1/2 BEN (MISCELLANEOUS) ×3 IMPLANT
CONN 3/8X1/2 ST GISH (MISCELLANEOUS) ×8 IMPLANT
CONN ST 1/4X3/8  BEN (MISCELLANEOUS) ×1
CONN ST 1/4X3/8 BEN (MISCELLANEOUS) ×3 IMPLANT
CRADLE DONUT ADULT HEAD (MISCELLANEOUS) ×4 IMPLANT
DEVICE ATRICLIP LAA PRCLPII 45 (Clip) ×3 IMPLANT
DEVICE CARDIOBLATE CARDIAC ABL (MISCELLANEOUS) IMPLANT
DRAPE CARDIOVASCULAR INCISE (DRAPES) ×1
DRAPE SLUSH/WARMER DISC (DRAPES) ×4 IMPLANT
DRAPE SRG 135X102X78XABS (DRAPES) ×3 IMPLANT
DRSG COVADERM 4X14 (GAUZE/BANDAGES/DRESSINGS) ×4 IMPLANT
ELECT CAUTERY BLADE 6.4 (BLADE) ×4 IMPLANT
ELECT REM PT RETURN 9FT ADLT (ELECTROSURGICAL) ×8
ELECTRODE REM PT RTRN 9FT ADLT (ELECTROSURGICAL) ×6 IMPLANT
FELT TEFLON 1X6 (MISCELLANEOUS) ×4 IMPLANT
GAUZE SPONGE 4X4 12PLY STRL (GAUZE/BANDAGES/DRESSINGS) ×4 IMPLANT
GLOVE BIO SURGEON STRL SZ 6 (GLOVE) IMPLANT
GLOVE BIO SURGEON STRL SZ 6.5 (GLOVE) IMPLANT
GLOVE BIO SURGEON STRL SZ7 (GLOVE) IMPLANT
GLOVE BIO SURGEON STRL SZ7.5 (GLOVE) IMPLANT
GLOVE BIOGEL PI IND STRL 6 (GLOVE) IMPLANT
GLOVE BIOGEL PI IND STRL 6.5 (GLOVE) IMPLANT
GLOVE BIOGEL PI IND STRL 7.0 (GLOVE) IMPLANT
GLOVE BIOGEL PI INDICATOR 6 (GLOVE)
GLOVE BIOGEL PI INDICATOR 6.5 (GLOVE)
GLOVE BIOGEL PI INDICATOR 7.0 (GLOVE)
GLOVE EUDERMIC 7 POWDERFREE (GLOVE) ×8 IMPLANT
GLOVE ORTHO TXT STRL SZ7.5 (GLOVE) IMPLANT
GOWN STRL REUS W/ TWL LRG LVL3 (GOWN DISPOSABLE) ×24 IMPLANT
GOWN STRL REUS W/ TWL XL LVL3 (GOWN DISPOSABLE) ×3 IMPLANT
GOWN STRL REUS W/TWL LRG LVL3 (GOWN DISPOSABLE) ×8
GOWN STRL REUS W/TWL XL LVL3 (GOWN DISPOSABLE) ×1
HEMOSTAT POWDER SURGIFOAM 1G (HEMOSTASIS) ×12 IMPLANT
HEMOSTAT SURGICEL 2X14 (HEMOSTASIS) ×4 IMPLANT
INSERT FOGARTY 61MM (MISCELLANEOUS) IMPLANT
INSERT FOGARTY XLG (MISCELLANEOUS) IMPLANT
IV CATH 18G X1.75 CATHLON (IV SOLUTION) ×4 IMPLANT
KIT BASIN OR (CUSTOM PROCEDURE TRAY) ×4 IMPLANT
KIT CATH CPB BARTLE (MISCELLANEOUS) ×4 IMPLANT
KIT ROOM TURNOVER OR (KITS) ×4 IMPLANT
KIT SUCTION CATH 14FR (SUCTIONS) ×4 IMPLANT
KIT VASOVIEW HEMOPRO VH 3000 (KITS) IMPLANT
LINE VENT (MISCELLANEOUS) ×4 IMPLANT
LOOP VESSEL SUPERMAXI WHITE (MISCELLANEOUS) ×8 IMPLANT
NS IRRIG 1000ML POUR BTL (IV SOLUTION) ×20 IMPLANT
PACK OPEN HEART (CUSTOM PROCEDURE TRAY) ×4 IMPLANT
PAD ARMBOARD 7.5X6 YLW CONV (MISCELLANEOUS) ×8 IMPLANT
PAD ELECT DEFIB RADIOL ZOLL (MISCELLANEOUS) ×4 IMPLANT
PENCIL BUTTON HOLSTER BLD 10FT (ELECTRODE) ×4 IMPLANT
PROBE CRYO2-ABLATION MALLABLE (MISCELLANEOUS) ×4 IMPLANT
PUNCH AORTIC ROTATE 4.0MM (MISCELLANEOUS) IMPLANT
PUNCH AORTIC ROTATE 4.5MM 8IN (MISCELLANEOUS) ×4 IMPLANT
PUNCH AORTIC ROTATE 5MM 8IN (MISCELLANEOUS) IMPLANT
SET CARDIOPLEGIA MPS 5001102 (MISCELLANEOUS) ×4 IMPLANT
SPONGE INTESTINAL PEANUT (DISPOSABLE) IMPLANT
SPONGE LAP 18X18 X RAY DECT (DISPOSABLE) IMPLANT
SPONGE LAP 4X18 X RAY DECT (DISPOSABLE) IMPLANT
SUT BONE WAX W31G (SUTURE) ×4 IMPLANT
SUT ETHIBOND 2 0 SH (SUTURE) ×4
SUT ETHIBOND 2 0 SH 36X2 (SUTURE) ×12 IMPLANT
SUT MNCRL AB 4-0 PS2 18 (SUTURE) IMPLANT
SUT PROLENE 3 0 SH DA (SUTURE) ×12 IMPLANT
SUT PROLENE 3 0 SH1 36 (SUTURE) ×4 IMPLANT
SUT PROLENE 4 0 RB 1 (SUTURE) ×5
SUT PROLENE 4 0 SH DA (SUTURE) IMPLANT
SUT PROLENE 4-0 RB1 .5 CRCL 36 (SUTURE) ×15 IMPLANT
SUT PROLENE 5 0 C 1 36 (SUTURE) ×8 IMPLANT
SUT PROLENE 6 0 C 1 30 (SUTURE) ×8 IMPLANT
SUT PROLENE 7 0 BV 1 (SUTURE) IMPLANT
SUT PROLENE 7 0 BV1 MDA (SUTURE) ×4 IMPLANT
SUT PROLENE 8 0 BV175 6 (SUTURE) ×8 IMPLANT
SUT SILK  1 MH (SUTURE) ×4
SUT SILK 1 MH (SUTURE) ×12 IMPLANT
SUT SILK 1 TIES 10X30 (SUTURE) ×4 IMPLANT
SUT SILK 2 0 SH CR/8 (SUTURE) ×8 IMPLANT
SUT SILK 2 0 TIES 10X30 (SUTURE) ×4 IMPLANT
SUT SILK 2 0 TIES 17X18 (SUTURE) ×1
SUT SILK 2-0 18XBRD TIE BLK (SUTURE) ×3 IMPLANT
SUT SILK 3 0 SH CR/8 (SUTURE) ×4 IMPLANT
SUT SILK 4 0 TIE 10X30 (SUTURE) ×8 IMPLANT
SUT STEEL 6MS V (SUTURE) ×16 IMPLANT
SUT STEEL STERNAL CCS#1 18IN (SUTURE) IMPLANT
SUT STEEL SZ 6 DBL 3X14 BALL (SUTURE) IMPLANT
SUT TEM PAC WIRE 2 0 SH (SUTURE) ×16 IMPLANT
SUT VIC AB 1 CTX 36 (SUTURE) ×2
SUT VIC AB 1 CTX36XBRD ANBCTR (SUTURE) ×6 IMPLANT
SUT VIC AB 2-0 CT1 27 (SUTURE)
SUT VIC AB 2-0 CT1 TAPERPNT 27 (SUTURE) IMPLANT
SUT VIC AB 2-0 CTX 27 (SUTURE) ×8 IMPLANT
SUT VIC AB 3-0 SH 27 (SUTURE)
SUT VIC AB 3-0 SH 27X BRD (SUTURE) IMPLANT
SUT VIC AB 3-0 X1 27 (SUTURE) ×8 IMPLANT
SUT VICRYL 4-0 PS2 18IN ABS (SUTURE) IMPLANT
SUTURE E-PAK OPEN HEART (SUTURE) IMPLANT
SYSTEM SAHARA CHEST DRAIN ATS (WOUND CARE) ×8 IMPLANT
TOWEL GREEN STERILE (TOWEL DISPOSABLE) ×8 IMPLANT
TOWEL GREEN STERILE FF (TOWEL DISPOSABLE) ×8 IMPLANT
TOWEL OR 17X24 6PK STRL BLUE (TOWEL DISPOSABLE) IMPLANT
TOWEL OR 17X26 10 PK STRL BLUE (TOWEL DISPOSABLE) IMPLANT
TRAY FOLEY SILVER 16FR TEMP (SET/KITS/TRAYS/PACK) ×4 IMPLANT
TUBING INSUFFLATION (TUBING) ×4 IMPLANT
UNDERPAD 30X30 (UNDERPADS AND DIAPERS) ×4 IMPLANT
VRC MALLEABLE SINGLE STG 34FR (MISCELLANEOUS) ×4
WATER STERILE IRR 1000ML POUR (IV SOLUTION) ×8 IMPLANT

## 2017-01-11 NOTE — Anesthesia Procedure Notes (Signed)
Anesthesia Procedure Image    

## 2017-01-11 NOTE — Progress Notes (Signed)
Patient gone for CABG. Hemoglobin stable. Anticipate patient will be transferred to TCTS service post-CABG.  Stephen Hawkingalph Nester Bachus, MD [facility] 01/11/2017, 2:12 PM Pager: [phone]

## 2017-01-11 NOTE — Anesthesia Preprocedure Evaluation (Addendum)
Anesthesia Evaluation  Patient identified by MRN, date of birth, ID band Patient awake    Reviewed: Allergy & Precautions, NPO status , Patient's Chart, lab work & pertinent test results  Airway Mallampati: II  TM Distance: >3 FB Neck ROM: Full    Dental  (+) Edentulous Upper, Edentulous Lower, Dental Advisory Given   Pulmonary    breath sounds clear to auscultation       Cardiovascular hypertension, + Past MI and + Cardiac Stents   Rhythm:Regular Rate:Normal     Neuro/Psych    GI/Hepatic   Endo/Other    Renal/GU      Musculoskeletal   Abdominal   Peds  Hematology   Anesthesia Other Findings   Reproductive/Obstetrics                            Anesthesia Physical Anesthesia Plan  ASA: IV  Anesthesia Plan: General   Post-op Pain Management:    Induction: Intravenous  PONV Risk Score and Plan:   Airway Management Planned: Oral ETT  Additional Equipment: Arterial line, CVP, PA Cath, TEE and Ultrasound Guidance Line Placement  Intra-op Plan:   Post-operative Plan: Post-operative intubation/ventilation  Informed Consent: I have reviewed the patients History and Physical, chart, labs and discussed the procedure including the risks, benefits and alternatives for the proposed anesthesia with the patient or authorized representative who has indicated his/her understanding and acceptance.   Dental advisory given  Plan Discussed with: CRNA, Anesthesiologist and Surgeon  Anesthesia Plan Comments:         Anesthesia Quick Evaluation

## 2017-01-11 NOTE — Brief Op Note (Signed)
01/02/2017 - 01/11/2017  11:42 AM  PATIENT:  Stephen Crawford  71 y.o. male  PRE-OPERATIVE DIAGNOSIS:  CAD, AF  POST-OPERATIVE DIAGNOSIS:  CAD, AF  PROCEDURE:  Procedure(s) with comments:  CORONARY ARTERY BYPASS GRAFTING x 1 -LIMA to LAD  MAZE (N/A) -Complete Bi-Atrial Lesion Set using Radio frequency ablation, cyrothermy -Clipping of LA Appendage TRANSESOPHAGEAL ECHOCARDIOGRAM (TEE) (N/A)  SURGEON:  Surgeon(s) and Role:    * Bartle, Payton DoughtyBryan K, MD - Primary  PHYSICIAN ASSISTANT: Derreck Wiltsey PA-C  ANESTHESIA:   general  EBL:    BLOOD ADMINISTERED: 3U PRBC, CELLSAVER,   DRAINS: Left and Right Pleural Chest Tubes, Mediastinal Chest drains   LOCAL MEDICATIONS USED:  NONE  SPECIMEN:  No Specimen  DISPOSITION OF SPECIMEN:  N/A  COUNTS:  YES  TOURNIQUET:  * No tourniquets in log *  DICTATION: .Dragon Dictation  PLAN OF CARE: Admit to inpatient   PATIENT DISPOSITION:  ICU - intubated and hemodynamically stable.   Delay start of Pharmacological VTE agent (>24hrs) due to surgical blood loss or risk of bleeding: yes

## 2017-01-11 NOTE — Anesthesia Postprocedure Evaluation (Signed)
Anesthesia Post Note  Patient: Stephen Crawford  Procedure(s) Performed: CORONARY ARTERY BYPASS GRAFTING (CABG), ON PUMP, TIMES ONE, USING LEFT INTERNAL MAMMARY ARTERY (N/A Chest) MAZE (N/A Chest) TRANSESOPHAGEAL ECHOCARDIOGRAM (TEE) (N/A ) CLIPPING OF LEFT ATRIAL APPENDAGE     Patient location during evaluation: PACU Anesthesia Type: General Level of consciousness: awake, awake and alert and oriented Pain management: pain level controlled Vital Signs Assessment: post-procedure vital signs reviewed and stable Respiratory status: spontaneous breathing, nonlabored ventilation and respiratory function stable Cardiovascular status: blood pressure returned to baseline Anesthetic complications: no    Last Vitals:  Vitals:   01/11/17 1728 01/11/17 1729  BP:    Pulse:  90  Resp:  14  Temp:  36.6 C  SpO2: 100% 100%    Last Pain:  Vitals:   01/11/17 0405  TempSrc:   PainSc: 0-No pain                 Landrie Beale COKER

## 2017-01-11 NOTE — Anesthesia Procedure Notes (Addendum)
Central Venous Catheter Insertion Performed by: Eilene Ghaziose, Denette Hass, MD, anesthesiologist Start/End11/19/2018 6:30 AM, 01/11/2017 6:47 AM Patient location: Pre-op. Preanesthetic checklist: patient identified, IV checked, site marked, risks and benefits discussed, surgical consent, monitors and equipment checked, pre-op evaluation, timeout performed and anesthesia consent Position: Trendelenburg Hand hygiene performed  and maximum sterile barriers used  Catheter size: 8.5 Fr PA cath was placed.Sheath introducer Swan type:thermodilution PA Cath depth:44 Procedure performed without using ultrasound guided technique. Ultrasound Notes:anatomy identified, needle tip was noted to be adjacent to the nerve/plexus identified, no ultrasound evidence of intravascular and/or intraneural injection and image(s) printed for medical record Attempts: 1 Following insertion, line sutured, dressing applied and Biopatch. Post procedure assessment: blood return through all ports, free fluid flow and no air  Patient tolerated the procedure well with no immediate complications.

## 2017-01-11 NOTE — Anesthesia Procedure Notes (Signed)
Procedure Name: Intubation Date/Time: 01/11/2017 8:02 AM Performed by: Nils PyleBell, Tia Hieronymus T, CRNA Pre-anesthesia Checklist: Patient identified, Emergency Drugs available, Suction available and Patient being monitored Patient Re-evaluated:Patient Re-evaluated prior to induction Oxygen Delivery Method: Circle System Utilized Preoxygenation: Pre-oxygenation with 100% oxygen Induction Type: IV induction Ventilation: Mask ventilation without difficulty and Oral airway inserted - appropriate to patient size Laryngoscope Size: Hyacinth MeekerMiller and 2 Grade View: Grade I Tube type: Oral Tube size: 8.0 mm Number of attempts: 1 Airway Equipment and Method: Stylet and Oral airway Placement Confirmation: ETT inserted through vocal cords under direct vision,  positive ETCO2 and breath sounds checked- equal and bilateral Secured at: 22 cm Tube secured with: Tape Dental Injury: Teeth and Oropharynx as per pre-operative assessment

## 2017-01-11 NOTE — Procedures (Signed)
Extubation Procedure Note  Patient Details:   Name: Stephen Crawford DOB: 09-28-1945 MRN: [id number]   Airway Documentation:  Airway 8 mm (Active)  Secured at (cm) 21 cm 01/11/2017  4:19 PM  Measured From Lips 01/11/2017  4:19 PM  Secured Location Center 01/11/2017  4:19 PM  Secured By [employer] Tube Holder 01/11/2017  4:19 PM  Tube Holder Repositioned Yes 01/11/2017  4:19 PM  Site Condition Dry 01/11/2017  4:19 PM    Evaluation  O2 sats: stable throughout Complications: No apparent complications Patient did tolerate procedure well. Bilateral Breath Sounds: Clear   Yes   Patient extubated per protocol to 4L [place] with no apparent complications. Positive cuff leak was noted prior to extubation. Patient achieved NIF of -24 and VC of 1.4L. Patient is oriented to time and place and is able to speak. Vitals are stable and sats are 99%. RT will continue to monitor.   Cordon Gassett Lajuana RippleM Kearah Gayden 01/11/2017, 6:01 PM

## 2017-01-11 NOTE — Progress Notes (Signed)
Cardiothoracic Surgery  [name] had a stable weekend with no arrhythmias, no chest pain or shortness of breath. Plan to proceed with CABG to LAD and MAZE procedure today.

## 2017-01-11 NOTE — Progress Notes (Signed)
TCTS BRIEF SICU PROGRESS NOTE  Day of Surgery  S/P Procedure(s) (LRB): CORONARY ARTERY BYPASS GRAFTING (CABG), ON PUMP, TIMES ONE, USING LEFT INTERNAL MAMMARY ARTERY (N/A) MAZE (N/A) TRANSESOPHAGEAL ECHOCARDIOGRAM (TEE) (N/A) CLIPPING OF LEFT ATRIAL APPENDAGE   Extubated uneventfully.  Sleepy but arousable AV paced w/ stable hemodynamics, PA pressures low O2 sats  97% on 4 L/min, mild hypercarbia Chest tube output 100-150 mL/hr, UOP > 100 mL/hr Repeat CBC pending, coags normal earlier  Plan: Recheck CBC.  Encourage deep breathing and recheck ABG.  Watch chest tube output  Purcell Nailslarence H Owen, MD 01/11/2017 7:49 PM

## 2017-01-11 NOTE — Progress Notes (Signed)
Rapid weaning protocol 

## 2017-01-11 NOTE — OR Nursing (Signed)
Twenty minute call to SICU charge nurse at 1255. Spoke to [place]. Cath Lab also notified of timing.

## 2017-01-11 NOTE — OR Nursing (Signed)
Forty-five minute call to SICU charge nurse at 1220. Spoke to Avery DennisonHeather.

## 2017-01-11 NOTE — Op Note (Signed)
CARDIOVASCULAR SURGERY OPERATIVE NOTE  01/11/2017  Surgeon:  Alleen Borne, MD  First Assistant: Lowella Dandy,  PA-C   Preoperative Diagnosis:  Severe single-vessel coronary artery disease and atrial fibrillation with RVR   Postoperative Diagnosis:  Same   Procedure:  1. Median Sternotomy 2. Extracorporeal circulation 3.   Coronary artery bypass grafting x 1   Left internal mammary graft to the LAD   4.   Maze IV: complete bi-atrial lesion set using cryo-thermy and radiofrequency clamp. 5.  Clipping of left atrial appendage   Anesthesia:  General Endotracheal   Clinical History/Surgical Indication:  The patient is a 71 year old gentleman with a history of coronary artery disease who suffered an MI in 2015 after undergoing a left TKR in [place]. Shortly after the procedure he had severe chest pressure and shortness of breath. He underwent stenting of one vessel at that time and then stenting of another vessel a few weeks later according to him. This was done in [place] so we do not have the details. He says that he did well after that without any symptoms.  His wife died in 12/29/2017and he started going to a gym everyday starting in March and lost 50 lbs, getting down to 160 lbs. He was still going to the gym at the time of admission and has felt fine with no chest pain or pressure, normal stamina, no shortness of breath. He was visiting a friend in [place] and got up in the middle of the night to go to the bathroom and in the bathroom had a syncopal episode. He says everything just went black with no symptoms before that. He fell and hit his face on the tub. His friend found him and got him out to a chair. Then he went back to the bathroom and the same thing happened a second time. The friend called the patient's daughter who instructed them to call EMS. In the ER he was noted to be  in atrial fib with RVR 180 and responded to Cardizem. He ruled in for a NSTEMI with an initial troponin of 0.05 and then rising to 2.8. His initial BUN was 80's with a creatinine of 1.24. An echo showed an EF of 65-70% with grade 1 diastolic dysfunction. There was no significant valvular dysfunction. He was started on Eliquis for his atrial fibrillation but had GI bleeding and it was stopped. Colonoscopy was negative for bleeding sites. His EGD showed some non-bleeding gastric erosions.  Cardiac cath today showed a 90% ostial LAD stenosis. The previous stents in the mid LAD are widely patent. There was a small Ramus with a 90% stenosis. The RCA had 20% proximal stenosis. The previously placed LCX stents were patent. LVEDP was normal with no aortic stenosis. CT of the head, face and neck on admission were negative for acute injury other than soft tissue swelling in the right face.   This 71 year old gentleman has a high grade ostial LAD stenosis and patent stents in the mid LAD and LCX. He presented with two syncopal episodes within a short period of time and was in atrial fib with RVR on presentation to the ER. I am not convinced that the atrial fib was responsible for his syncope but he could have had an ischemic arrhythmia like VT although it has not been documented. I agree that CABG with a LIMA to the LAD and MAZE procedure with clipping of the LAA is the best treatment for this patient. He  may not be able to be maintained on anticoagulation with this GI bleeding risk although he has tolerated ASA and Plavix prior to admission.   I discussed the operative procedure with the patient and his daughter by telephone including alternatives, benefits and risks; including but not limited to bleeding, blood transfusion, infection, stroke, myocardial infarction, graft failure, heart block requiring a permanent pacemaker, organ dysfunction, and death.  Stephen Crawford understands and agrees to proceed.      Preparation:  The patient was seen in the preoperative holding area and the correct patient, correct operation were confirmed with the patient after reviewing the medical record and catheterization. The consent was signed by me. Preoperative antibiotics were given. A pulmonary arterial line and radial arterial line were placed by the anesthesia team. The patient was taken back to the operating room and positioned supine on the operating room table. After being placed under general endotracheal anesthesia by the anesthesia team a foley catheter was placed. The neck, chest, abdomen, and both legs were prepped with betadine soap and solution and draped in the usual sterile manner. A surgical time-out was taken and the correct patient and operative procedure were confirmed with the nursing and anesthesia staff.   Cardiopulmonary Bypass:  A median sternotomy was performed. The pericardium was opened in the midline. Right ventricular function appeared normal. The ascending aorta was of normal size and had no palpable plaque. There were no contraindications to aortic cannulation or cross-clamping. The patient was fully systemically heparinized and the ACT was maintained > 400 sec. The proximal aortic arch was cannulated with a 20 F aortic cannula for arterial inflow. Bi-caval venous cannulation was performed using a 24 F metal tip right angle cannula in the SVC and a 34 F plastic malleable cannula in the IVC.  An antegrade cardioplegia/vent cannula was inserted into the mid-ascending aorta. Aortic occlusion was performed with a single cross-clamp. Systemic cooling to 32 degrees Centigrade and topical cooling of the heart with iced saline were used. Hyperkalemic antegrade cold blood cardioplegia was used to induce diastolic arrest and was then given at about 20 minute intervals throughout the period of arrest to maintain myocardial temperature at or below 10 degrees centigrade. A temperature probe was  inserted into the interventricular septum and an insulating pad was placed in the pericardium.   Left internal mammary harvest:  The left side of the sternum was retracted using the Rultract retractor. The left internal mammary artery was harvested as a pedicle graft. All side branches were clipped. It was a medium-sized vessel of good quality with excellent blood flow. It was ligated distally and divided. It was sprayed with topical papaverine solution to prevent vasospasm.   Maze IV:   After achieving diastolic arrest the left pulmonary veins were circled with a tape. An Atricure bipolar RF clamp was used to create a lesion around the pulmonary veins on the left atrial wall. An RF lesion was then created at the base of the left atrial appendage. The cryo probe was then used to create a lesion joining the LAA and the left pulmonary veins. All cryo lesions were for 2 minutes. The left atrium was opened by a vertical incision in the interatrial groove. An encircling lesion was then created around the right pulmonary veins using the atriotomy incision anteriorly and a cryo lesion posteriorly. Then cryo lesions were created to join the pulmonary vein lesions superiorly and inferiorly. A cryo lesion was placed between the inferior lesion that joined the pulmonary veins  and the posterior mitral annulus at P2-3. A lesion was placed externally across the coronary sinus. The left atriotomy was then closed using two layers of continuous 3-0 prolene suture.  The right atrium was opened obliquely laterally.  A radiofrequency lesion was created to join the SVC and IVC posteriorly along the interatrial septum. A radiofrequency lesion was created from the mid portion of the oblique incision to the tip of the right atrial appendage.  A cryo lesion was placed from the anterior aspect of this oblique atriotomy down to the tricuspid annulus at 2:00. The right atriotomy was then closed with two layers of 4-0 prolene suture.    Clipping of the left atrial appendage:  The base of the LAA was measured and a 45 mm Atricure AtriClip PRO 245 was placed across the base ( Lot [id number] ).    Coronary arteries:  The coronary arteries were examined.   LAD:  Large vessel with no  Distal disease.  Grafts:  1. LIMA to the LAD: 2.5 mm. It was sewn end to side using 8-0 prolene continuous suture.    Completion:  The patient was rewarmed to 37 degrees Centigrade. The clamp was removed from the LIMA pedicle and there was rapid warming of the septum and return of ventricular fibrillation. The crossclamp was removed with a time of 99 minutes. There was spontaneous return of sinus rhythm. The distal and proximal anastomoses were checked for hemostasis. The position of the grafts was satisfactory. Two temporary epicardial pacing wires were placed on the right atrium and two on the right ventricle. The patient was weaned from CPB without difficulty on no inotropes. CPB time was 125 minutes. Cardiac output was 5 LPM. Heparin was fully reversed with protamine and the aortic and venous cannulas removed. Hemostasis was achieved. Mediastinal and left pleural drainage tubes were placed. The sternum was closed with  #6 stainless steel wires. The fascia was closed with continuous # 1 vicryl suture. The subcutaneous tissue was closed with 2-0 vicryl continuous suture. The skin was closed with 3-0 vicryl subcuticular suture. All sponge, needle, and instrument counts were reported correct at the end of the case. Dry sterile dressings were placed over the incisions and around the chest tubes which were connected to pleurevac suction. The patient was then transported to the surgical intensive care unit in critical but stable condition.

## 2017-01-11 NOTE — Transfer of Care (Signed)
Immediate Anesthesia Transfer of Care Note  Patient: Stephen PeruWilliam Crawford  Procedure(s) Performed: CORONARY ARTERY BYPASS GRAFTING (CABG), ON PUMP, TIMES ONE, USING LEFT INTERNAL MAMMARY ARTERY (N/A Chest) MAZE (N/A Chest) TRANSESOPHAGEAL ECHOCARDIOGRAM (TEE) (N/A ) CLIPPING OF LEFT ATRIAL APPENDAGE  Patient Location: SICU  Anesthesia Type:General  Level of Consciousness: unresponsive and Patient remains intubated per anesthesia plan  Airway & Oxygen Therapy: Patient remains intubated per anesthesia plan and Patient placed on Ventilator (see vital sign flow sheet for setting)  Post-op Assessment: Report given to RN and Post -op Vital signs reviewed and stable  Post vital signs: Reviewed and stable  Last Vitals:  Vitals:   01/10/17 2330 01/11/17 0533  BP:  134/72  Pulse:  76  Resp: 18   Temp: 37.1 C   SpO2: 98%     Last Pain:  Vitals:   01/11/17 0405  TempSrc:   PainSc: 0-No pain      Patients Stated Pain Goal: 0 (01/11/17 0405)  Complications: No apparent anesthesia complications

## 2017-01-11 NOTE — Addendum Note (Signed)
Addendum  created 01/11/17 1920 by Kipp BroodJoslin, Franshesca Chipman, MD   Diagnosis association updated

## 2017-01-11 NOTE — Progress Notes (Signed)
Increased RR to 14 per MD.

## 2017-01-11 NOTE — Anesthesia Procedure Notes (Signed)
Arterial Line Insertion Start/End11/19/2018 6:50 AM, 01/11/2017 7:02 AM Performed by: Nils PyleBell, Sandy Blouch T, CRNA, CRNA  Patient location: Pre-op. Preanesthetic checklist: patient identified, IV checked, site marked, risks and benefits discussed, surgical consent, monitors and equipment checked, pre-op evaluation and anesthesia consent Lidocaine 1% used for infiltration and patient sedated Left, radial was placed Catheter size: 20 G Hand hygiene performed  and maximum sterile barriers used   Attempts: 1 Procedure performed without using ultrasound guided technique. Ultrasound Notes:anatomy identified, needle tip was noted to be adjacent to the nerve/plexus identified and no ultrasound evidence of intravascular and/or intraneural injection Following insertion, dressing applied and Biopatch. Post procedure assessment: normal  Patient tolerated the procedure well with no immediate complications.

## 2017-01-11 NOTE — Progress Notes (Signed)
  Echocardiogram Echocardiogram Transesophageal has been performed.  Stephen SavoyCasey N Anayelli Crawford 01/11/2017, 10:21 AM

## 2017-01-12 ENCOUNTER — Encounter (HOSPITAL_COMMUNITY): Payer: Self-pay | Admitting: Surgery

## 2017-01-12 ENCOUNTER — Inpatient Hospital Stay (HOSPITAL_COMMUNITY): Payer: Medicare HMO

## 2017-01-12 LAB — CBC
HCT: 26.3 % — ABNORMAL LOW (ref 39.0–52.0)
HCT: 23.5 % — ABNORMAL LOW (ref 39.0–52.0)
HEMOGLOBIN: 7.9 g/dL — AB (ref 13.0–17.0)
Hemoglobin: 8.9 g/dL — ABNORMAL LOW (ref 13.0–17.0)
MCH: 31.8 pg (ref 26.0–34.0)
MCH: 31.7 pg (ref 26.0–34.0)
MCHC: 33.8 g/dL (ref 30.0–36.0)
MCHC: 33.6 g/dL (ref 30.0–36.0)
MCV: 93.9 fL (ref 78.0–100.0)
MCV: 94.4 fL (ref 78.0–100.0)
PLATELETS: 156 10*3/uL (ref 150–400)
PLATELETS: 150 10*3/uL (ref 150–400)
RBC: 2.8 MIL/uL — AB (ref 4.22–5.81)
RBC: 2.49 MIL/uL — AB (ref 4.22–5.81)
RDW: 18 % — ABNORMAL HIGH (ref 11.5–15.5)
RDW: 17.9 % — ABNORMAL HIGH (ref 11.5–15.5)
WBC: 18.7 10*3/uL — AB (ref 4.0–10.5)
WBC: 14.1 10*3/uL — ABNORMAL HIGH (ref 4.0–10.5)

## 2017-01-12 LAB — GLUCOSE, CAPILLARY
GLUCOSE-CAPILLARY: 113 mg/dL — AB (ref 65–99)
GLUCOSE-CAPILLARY: 115 mg/dL — AB (ref 65–99)
GLUCOSE-CAPILLARY: 123 mg/dL — AB (ref 65–99)
GLUCOSE-CAPILLARY: 126 mg/dL — AB (ref 65–99)

## 2017-01-12 LAB — BASIC METABOLIC PANEL WITH GFR
Anion gap: 7 (ref 5–15)
BUN: 14 mg/dL (ref 6–20)
CO2: 22 mmol/L (ref 22–32)
Calcium: 7.9 mg/dL — ABNORMAL LOW (ref 8.9–10.3)
Chloride: 107 mmol/L (ref 101–111)
Creatinine, Ser: 1.17 mg/dL (ref 0.61–1.24)
GFR calc Af Amer: >60 mL/min
GFR calc non Af Amer: >60 mL/min
Glucose, Bld: 117 mg/dL — ABNORMAL HIGH (ref 65–99)
Potassium: 4.9 mmol/L (ref 3.5–5.1)
Sodium: 136 mmol/L (ref 135–145)

## 2017-01-12 LAB — POCT I-STAT, CHEM 8
BUN: 17 mg/dL (ref 6–20)
CALCIUM ION: 1.16 mmol/L (ref 1.15–1.40)
CHLORIDE: 101 mmol/L (ref 101–111)
CREATININE: 1.1 mg/dL (ref 0.61–1.24)
Glucose, Bld: 112 mg/dL — ABNORMAL HIGH (ref 65–99)
HCT: 24 % — ABNORMAL LOW (ref 39.0–52.0)
Hemoglobin: 8.2 g/dL — ABNORMAL LOW (ref 13.0–17.0)
Potassium: 4.2 mmol/L (ref 3.5–5.1)
SODIUM: 135 mmol/L (ref 135–145)
TCO2: 22 mmol/L (ref 22–32)

## 2017-01-12 LAB — CREATININE, SERUM
CREATININE: 1.17 mg/dL (ref 0.61–1.24)
GFR calc Af Amer: >60 mL/min (ref >60)

## 2017-01-12 LAB — MAGNESIUM
MAGNESIUM: 2.1 mg/dL (ref 1.7–2.4)
Magnesium: 2.4 mg/dL (ref 1.7–2.4)

## 2017-01-12 MED ORDER — MOVING RIGHT ALONG BOOK
Freq: Once | Status: AC
  Administered 2017-01-12: 04:00:00
  Filled 2017-01-12: qty 1

## 2017-01-12 MED ORDER — MUPIROCIN 2 % EX OINT
1.0000 "application " | TOPICAL_OINTMENT | Freq: Two times a day (BID) | CUTANEOUS | Status: DC
  Administered 2017-01-12 – 2017-01-15 (×7): 1 via NASAL
  Filled 2017-01-12 (×2): qty 22

## 2017-01-12 MED ORDER — CHLORHEXIDINE GLUCONATE CLOTH 2 % EX PADS
6.0000 | MEDICATED_PAD | Freq: Every day | CUTANEOUS | Status: DC
  Administered 2017-01-12 – 2017-01-15 (×4): 6 via TOPICAL

## 2017-01-12 MED FILL — Lidocaine HCl IV Inj 20 MG/ML: INTRAVENOUS | Qty: 5 | Status: AC

## 2017-01-12 MED FILL — Heparin Sodium (Porcine) Inj 1000 Unit/ML: INTRAMUSCULAR | Qty: 30 | Status: AC

## 2017-01-12 MED FILL — Magnesium Sulfate Inj 50%: INTRAMUSCULAR | Qty: 10 | Status: AC

## 2017-01-12 MED FILL — Sodium Chloride IV Soln 0.9%: INTRAVENOUS | Qty: 2000 | Status: AC

## 2017-01-12 MED FILL — Potassium Chloride Inj 2 mEq/ML: INTRAVENOUS | Qty: 20 | Status: AC

## 2017-01-12 MED FILL — Mannitol IV Soln 20%: INTRAVENOUS | Qty: 500 | Status: AC

## 2017-01-12 MED FILL — Electrolyte-R (PH 7.4) Solution: INTRAVENOUS | Qty: 5000 | Status: AC

## 2017-01-12 MED FILL — Sodium Bicarbonate IV Soln 8.4%: INTRAVENOUS | Qty: 50 | Status: AC

## 2017-01-12 NOTE — Progress Notes (Signed)
1 Day Post-Op Procedure(s) (LRB): CORONARY ARTERY BYPASS GRAFTING (CABG), ON PUMP, TIMES ONE, USING LEFT INTERNAL MAMMARY ARTERY (N/A) MAZE (N/A) TRANSESOPHAGEAL ECHOCARDIOGRAM (TEE) (N/A) CLIPPING OF LEFT ATRIAL APPENDAGE Subjective:  No complaints  Objective: Vital signs in last 24 hours: Temp:  [97.3 F (36.3 C)-98.4 F (36.9 C)] 98.4 F (36.9 C) (11/20 0715) Pulse Rate:  [71-93] 86 (11/20 0715) Cardiac Rhythm: Atrial paced (11/20 0500) Resp:  [5-25] 15 (11/20 0715) BP: (75-123)/(55-70) 106/56 (11/20 0700) SpO2:  [95 %-100 %] 99 % (11/20 0715) Arterial Line BP: (59-161)/(37-155) 100/76 (11/20 0500) FiO2 (%):  [36 %-50 %] 36 % (11/19 1801) Weight:  [75.9 kg (167 lb 6.4 oz)] 75.9 kg (167 lb 6.4 oz) (11/20 0445)  Hemodynamic parameters for last 24 hours: PAP: (16-37)/(4-19) 16/4 CO:  [4.6 L/min-5.7 L/min] 5.7 L/min CI:  [2.5 L/min/m2-3.1 L/min/m2] 3.1 L/min/m2  Intake/Output from previous day: 11/19 0701 - 11/20 0700 In: 6227.2 [I.V.:3657.2; Blood:720; IV Piggyback:1850] Out: 5350 [Urine:2650; Blood:1220; Chest Tube:1480] Intake/Output this shift: No intake/output data recorded.  General appearance: alert and cooperative Neurologic: intact Heart: regular rate and rhythm, S1, S2 normal, no murmur, click, rub or gallop Lungs: clear to auscultation bilaterally Extremities: edema mild Wound: dressing dry  Lab Results: Recent Labs    01/11/17 1933 01/11/17 1943 01/12/17 0406  WBC 15.7*  --  18.7*  HGB 9.1* 8.8* 8.9*  HCT 27.2* 26.0* 26.3*  PLT 144*  --  156   BMET:  Recent Labs    01/11/17 0353  01/11/17 1943 01/12/17 0406  NA 136   < > 138 136  K 3.8   < > 4.7 4.9  CL 107   < > 107 107  CO2 23  --   --  22  GLUCOSE 89   < > 135* 117*  BUN 16   < > 15 14  CREATININE 1.21   < > 1.00 1.17  CALCIUM 8.3*  --   --  7.9*   < > = values in this interval not displayed.    PT/INR:  Recent Labs    01/11/17 1333  LABPROT 16.7*  INR 1.37   ABG     Component Value Date/Time   PHART 7.369 01/11/2017 2206   HCO3 22.9 01/11/2017 2206   TCO2 24 01/11/2017 2206   ACIDBASEDEF 2.0 01/11/2017 2206   O2SAT 99.0 01/11/2017 2206   CBG (last 3)  Recent Labs    01/11/17 1555 01/12/17 0008 01/12/17 0404  GLUCAP 110* 126* 115*   CXR: ok  ECG: sinus, RBBB (old) non-specific ST changes  Assessment/Plan: S/P Procedure(s) (LRB): CORONARY ARTERY BYPASS GRAFTING (CABG), ON PUMP, TIMES ONE, USING LEFT INTERNAL MAMMARY ARTERY (N/A) MAZE (N/A) TRANSESOPHAGEAL ECHOCARDIOGRAM (TEE) (N/A) CLIPPING OF LEFT ATRIAL APPENDAGE  He is hemodynamically stable and off levophed since 5 am. CI is 3.8.  Rhythm is sinus. Will hold off on beta blocker this am until we are sure BP is stable off levophed.  Chest tube output is decreasing but will keep in this am and reevaluate later today.  Expected acute postop blood loss anemia: start iron  Preop GI bleeding on Eliquis. He would probably be at increased risk with Coumadin too although I would typically put on Coumadin for 3 months after MAZE. It is probably best to put him on ASA and Plavix as before.  Volume excess: wt is 10 lbs up from preop. Will diurese later if BP remains stable off levophed.  OOB, IS.  DC swan and arterial  line.   LOS: 10 days    Alleen BorneBryan K Carolin Quang 01/12/2017

## 2017-01-12 NOTE — Progress Notes (Signed)
Anesthesiology Follow-up:  Awake and alert, neuro intact, walking with assistance. Maintaining sinus rhythm, levophed weaned off this morning at 5:00 AM.   VS: T- 36.9 BP- 111/48 HR- 74 (SR)  RR 16 O2 Sat 98% on 2L O2  PA 29/12 CO/CI: 5.7/3.1 last night  K-4.9 Na- 136 BUN/Cr. 14/1.17 glucose- 117 H/H- 8.9/26.3 platelets- 156,000  Extubated 6 hours psot-op.  71 year old male one day S/P CABG X 1 with maze procedure and LAA ligation. Stable post-op course, required levophed and neosynephrine initially to maintain BP. Doing well at present.  Kipp Broodavid Ikran Patman

## 2017-01-12 NOTE — Progress Notes (Signed)
Patient ID: Stephen Crawford, male   DOB: 1945/08/20, 71 y.o.   MRN: [id number] EVENING ROUNDS NOTE :     [address]       [place] [id number]             [phone]                 1 Day Post-Op Procedure(s) (LRB): CORONARY ARTERY BYPASS GRAFTING (CABG), ON PUMP, TIMES ONE, USING LEFT INTERNAL MAMMARY ARTERY (N/A) MAZE (N/A) TRANSESOPHAGEAL ECHOCARDIOGRAM (TEE) (N/A) CLIPPING OF LEFT ATRIAL APPENDAGE  Total Length of Stay:  LOS: 10 days  BP (!) 114/55   Pulse 75   Temp 98.7 F (37.1 C) (Oral)   Resp 18   Ht 5\' 9"  (1.753 m)   Wt 167 lb 6.4 oz (75.9 kg)   SpO2 96%   BMI 24.72 kg/m   .Intake/Output      11/20 0701 - 11/21 0700   P.O. 100   I.V. (mL/kg) 163 (2.1)   Blood    IV Piggyback 50   Total Intake(mL/kg) 313 (4.1)   Urine (mL/kg/hr) 385 (0.4)   Blood    Chest Tube 350   Total Output 735   Net -422         . sodium chloride 20 mL/hr at 01/12/17 0700  . sodium chloride    . sodium chloride 20 mL/hr at 01/12/17 0700  . cefUROXime (ZINACEF)  IV Stopped (01/12/17 0914)  . lactated ringers Stopped (01/11/17 1423)  . lactated ringers Stopped (01/12/17 1500)  . nitroGLYCERIN Stopped (01/11/17 1424)     Lab Results  Component Value Date   WBC 14.1 (H) 01/12/2017   HGB 8.2 (L) 01/12/2017   HCT 24.0 (L) 01/12/2017   PLT 150 01/12/2017   GLUCOSE 112 (H) 01/12/2017   CHOL 73 01/06/2017   TRIG 45 01/06/2017   HDL 23 (L) 01/06/2017   LDLCALC 41 01/06/2017   ALT 20 01/10/2017   AST 26 01/10/2017   NA 135 01/12/2017   K 4.2 01/12/2017   CL 101 01/12/2017   CREATININE 1.10 01/12/2017   BUN 17 01/12/2017   CO2 22 01/12/2017   TSH 0.412 01/02/2017   INR 1.37 01/11/2017   HGBA1C 4.8 01/10/2017   hgb down from this am  7.9/ 23.5% Stable day Ambulated around unit Sinus rhythm  Delight OvensEdward B Hareem Surowiec MD  Beeper [phone] Office [phone] 01/12/2017 7:12 PM

## 2017-01-12 NOTE — Plan of Care (Signed)
  Progressing Cardiac: Hemodynamic stability will improve 01/12/2017 2237 - Progressing by Marin CommentFrei, Isaac Dubie C, RN Note Pt continues to maintain adequate hemodynamic stability (I.e. WDL blood pressures, heart rate, UOP) Activity: Risk for activity intolerance will decrease 01/12/2017 2237 - Progressing by Marin CommentFrei, Makynlee Kressin C, RN Note Pt tolerated sitting up in chair for three hours this evening.

## 2017-01-12 NOTE — Progress Notes (Signed)
EKG CRITICAL VALUE     12 lead EKG performed.  Critical value noted.  Shanda BumpsJessica, RN notified.   Lorelee MarketKerry A Reene Harlacher 01/12/2017 7:13 AM

## 2017-01-12 NOTE — Addendum Note (Signed)
Addendum  created 01/12/17 1107 by Kipp BroodJoslin, Labaron Digirolamo, MD   Sign clinical note

## 2017-01-13 ENCOUNTER — Inpatient Hospital Stay (HOSPITAL_COMMUNITY): Payer: Medicare HMO

## 2017-01-13 LAB — BASIC METABOLIC PANEL
ANION GAP: 4 — AB (ref 5–15)
BUN: 20 mg/dL (ref 6–20)
CO2: 24 mmol/L (ref 22–32)
Calcium: 7.9 mg/dL — ABNORMAL LOW (ref 8.9–10.3)
Chloride: 105 mmol/L (ref 101–111)
Creatinine, Ser: 1.13 mg/dL (ref 0.61–1.24)
GFR calc Af Amer: >60 mL/min (ref >60)
GLUCOSE: 95 mg/dL (ref 65–99)
POTASSIUM: 4.1 mmol/L (ref 3.5–5.1)
Sodium: 133 mmol/L — ABNORMAL LOW (ref 135–145)

## 2017-01-13 LAB — CBC
HCT: 23.1 % — ABNORMAL LOW (ref 39.0–52.0)
HEMOGLOBIN: 7.8 g/dL — AB (ref 13.0–17.0)
MCH: 31.7 pg (ref 26.0–34.0)
MCHC: 33.8 g/dL (ref 30.0–36.0)
MCV: 93.9 fL (ref 78.0–100.0)
Platelets: 142 10*3/uL — ABNORMAL LOW (ref 150–400)
RBC: 2.46 MIL/uL — ABNORMAL LOW (ref 4.22–5.81)
RDW: 17.5 % — ABNORMAL HIGH (ref 11.5–15.5)
WBC: 11.7 10*3/uL — ABNORMAL HIGH (ref 4.0–10.5)

## 2017-01-13 LAB — GLUCOSE, CAPILLARY
GLUCOSE-CAPILLARY: 108 mg/dL — AB (ref 65–99)
Glucose-Capillary: 91 mg/dL (ref 65–99)

## 2017-01-13 MED ORDER — TRAMADOL HCL 50 MG PO TABS: 50.0000 mg | ORAL_TABLET | Freq: Four times a day (QID) | ORAL | Status: DC | PRN

## 2017-01-13 MED ORDER — ASPIRIN 81 MG PO CHEW
81.0000 mg | CHEWABLE_TABLET | Freq: Every day | ORAL | Status: DC
  Administered 2017-01-15: 81 mg
  Filled 2017-01-13: qty 1

## 2017-01-13 MED ORDER — DOCUSATE SODIUM 100 MG PO CAPS: 200.0000 mg | ORAL_CAPSULE | Freq: Every day | ORAL | Status: DC

## 2017-01-13 MED ORDER — OXYCODONE HCL 5 MG PO TABS: 5.0000 mg | ORAL_TABLET | ORAL | Status: DC | PRN

## 2017-01-13 MED ORDER — ACETAMINOPHEN 325 MG PO TABS: 650.0000 mg | ORAL_TABLET | Freq: Four times a day (QID) | ORAL | Status: DC | PRN

## 2017-01-13 MED ORDER — ASPIRIN EC 81 MG PO TBEC: 81.0000 mg | DELAYED_RELEASE_TABLET | Freq: Every day | ORAL | Status: DC

## 2017-01-13 MED ORDER — POTASSIUM CHLORIDE CRYS ER 20 MEQ PO TBCR
20.0000 meq | EXTENDED_RELEASE_TABLET | Freq: Two times a day (BID) | ORAL | Status: DC
  Administered 2017-01-13 – 2017-01-15 (×5): 20 meq via ORAL
  Filled 2017-01-13 (×5): qty 1

## 2017-01-13 MED ORDER — SODIUM CHLORIDE 0.9% FLUSH: 3.0000 mL | INTRAVENOUS | Status: DC | PRN

## 2017-01-13 MED ORDER — METOPROLOL TARTRATE 12.5 MG HALF TABLET
12.5000 mg | ORAL_TABLET | Freq: Two times a day (BID) | ORAL | Status: DC
  Administered 2017-01-13 – 2017-01-14 (×4): 12.5 mg via ORAL
  Filled 2017-01-13 (×4): qty 1

## 2017-01-13 MED ORDER — BISACODYL 5 MG PO TBEC: 10.0000 mg | DELAYED_RELEASE_TABLET | Freq: Every day | ORAL | Status: DC | PRN

## 2017-01-13 MED ORDER — SODIUM CHLORIDE 0.9 % IV SOLN: 250.0000 mL | INTRAVENOUS | Status: DC | PRN

## 2017-01-13 MED ORDER — BISACODYL 10 MG RE SUPP: 10.0000 mg | Freq: Every day | RECTAL | Status: DC | PRN

## 2017-01-13 MED ORDER — FE FUMARATE-B12-VIT C-FA-IFC PO CAPS
1.0000 | ORAL_CAPSULE | Freq: Three times a day (TID) | ORAL | Status: DC
  Administered 2017-01-13 – 2017-01-15 (×7): 1 via ORAL
  Filled 2017-01-13 (×7): qty 1

## 2017-01-13 MED ORDER — MOVING RIGHT ALONG BOOK
Freq: Once | Status: DC
  Filled 2017-01-13: qty 1

## 2017-01-13 MED ORDER — PANTOPRAZOLE SODIUM 40 MG PO TBEC: 40.0000 mg | DELAYED_RELEASE_TABLET | Freq: Every day | ORAL | Status: DC

## 2017-01-13 MED ORDER — FUROSEMIDE 40 MG PO TABS
40.0000 mg | ORAL_TABLET | Freq: Every day | ORAL | Status: AC
  Administered 2017-01-13 – 2017-01-15 (×3): 40 mg via ORAL
  Filled 2017-01-13 (×3): qty 1

## 2017-01-13 MED ORDER — ONDANSETRON HCL 4 MG PO TABS: 4.0000 mg | ORAL_TABLET | Freq: Four times a day (QID) | ORAL | Status: DC | PRN

## 2017-01-13 MED ORDER — SODIUM CHLORIDE 0.9% FLUSH: 3.0000 mL | Freq: Two times a day (BID) | INTRAVENOUS | Status: DC

## 2017-01-13 MED ORDER — ONDANSETRON HCL 4 MG/2ML IJ SOLN: 4.0000 mg | Freq: Four times a day (QID) | INTRAMUSCULAR | Status: DC | PRN

## 2017-01-13 MED ORDER — ASPIRIN EC 81 MG PO TBEC
81.0000 mg | DELAYED_RELEASE_TABLET | Freq: Every day | ORAL | Status: DC
  Administered 2017-01-13 – 2017-01-14 (×2): 81 mg via ORAL
  Filled 2017-01-13 (×3): qty 1

## 2017-01-13 NOTE — Progress Notes (Signed)
2 Days Post-Op Procedure(s) (LRB): CORONARY ARTERY BYPASS GRAFTING (CABG), ON PUMP, TIMES ONE, USING LEFT INTERNAL MAMMARY ARTERY (N/A) MAZE (N/A) TRANSESOPHAGEAL ECHOCARDIOGRAM (TEE) (N/A) CLIPPING OF LEFT ATRIAL APPENDAGE Subjective: No complaints  Objective: Vital signs in last 24 hours: Temp:  [98.2 F (36.8 C)-98.9 F (37.2 C)] 98.2 F (36.8 C) (11/21 0757) Pulse Rate:  [73-125] 92 (11/21 0600) Cardiac Rhythm: Normal sinus rhythm (11/21 0400) Resp:  [16-26] 20 (11/21 0600) BP: (97-145)/(48-81) 116/59 (11/21 0600) SpO2:  [92 %-99 %] 95 % (11/21 0600) Weight:  [76.3 kg (168 lb 4.8 oz)] 76.3 kg (168 lb 4.8 oz) (11/21 0600)  Hemodynamic parameters for last 24 hours:    Intake/Output from previous day: 11/20 0701 - 11/21 0700 In: 1003 [P.O.:100; I.V.:853; IV Piggyback:50] Out: 1410 [Urine:900; Chest Tube:510] Intake/Output this shift: No intake/output data recorded.  General appearance: alert and cooperative Neurologic: intact Heart: regular rate and rhythm, S1, S2 normal, no murmur, click, rub or gallop Lungs: diminished breath sounds bibasilar Abdomen: soft, non-tender; bowel sounds normal; no masses,  no organomegaly Extremities: edema mild Wound: incisions ok  Lab Results: Recent Labs    01/12/17 1629 01/12/17 1652 01/13/17 0354  WBC 14.1*  --  11.7*  HGB 7.9* 8.2* 7.8*  HCT 23.5* 24.0* 23.1*  PLT 150  --  142*   BMET:  Recent Labs    01/12/17 0406  01/12/17 1652 01/13/17 0354  NA 136  --  135 133*  K 4.9  --  4.2 4.1  CL 107  --  101 105  CO2 22  --   --  24  GLUCOSE 117*  --  112* 95  BUN 14  --  17 20  CREATININE 1.17   < > 1.10 1.13  CALCIUM 7.9*  --   --  7.9*   < > = values in this interval not displayed.    PT/INR:  Recent Labs    01/11/17 1333  LABPROT 16.7*  INR 1.37   ABG    Component Value Date/Time   PHART 7.369 01/11/2017 2206   HCO3 22.9 01/11/2017 2206   TCO2 22 01/12/2017 1652   ACIDBASEDEF 2.0 01/11/2017 2206   O2SAT  99.0 01/11/2017 2206   CBG (last 3)  Recent Labs    01/12/17 0814 01/12/17 1144 01/13/17 0753  GLUCAP 113* 123* 91   CLINICAL DATA:  Status post coronary bypass grafting  EXAM: PORTABLE CHEST 1 VIEW  COMPARISON:  01/12/2017  FINDINGS: Cardiac shadow is stable. Postoperative changes are again seen. The Swan-Ganz catheter has been removed in the interval although the jugular sheath remains. Bilateral thoracostomy catheters and mediastinal drain are seen. No significant pneumothorax is noted. Patchy bibasilar atelectatic changes are noted left greater than right.  IMPRESSION: Tubes and lines as described.  Slight increase in bibasilar atelectasis.   Electronically Signed   By: Alcide CleverMark  Lukens M.D.   On: 01/13/2017 08:41  Assessment/Plan: S/P Procedure(s) (LRB): CORONARY ARTERY BYPASS GRAFTING (CABG), ON PUMP, TIMES ONE, USING LEFT INTERNAL MAMMARY ARTERY (N/A) MAZE (N/A) TRANSESOPHAGEAL ECHOCARDIOGRAM (TEE) (N/A) CLIPPING OF LEFT ATRIAL APPENDAGE  He is hemodynamically stable in sinus rhythm in the 70's. Will start low dose Lopressor.  Preop atrial fib with RVR and syncope: sinus s/p MAZE.   Preop acute blood loss anemia due to GI bleed on Eliquis: He is still anemic postop but no sign of GI bleeding. Will start iron. Will continue ASA 81 mg for now and plan to resume Plavix at discharge. I would  not use Coumadin or Eliquis in this patient with recent GI bleeding.  Mild volume excess: continue diuretic and KCL  Atelectasis: continue IS and ambulation  DC chest tubes, sleeve and foley  Transfer to 4E.   LOS: 11 days    Alleen BorneBryan K Brannon Decaire 01/13/2017

## 2017-01-13 NOTE — Progress Notes (Signed)
EKG CRITICAL VALUE     12 lead EKG performed.  Critical value noted.  Hyou, RN notified.   Lorelee MarketKerry A Chenita Ruda 01/13/2017 9:11 AM

## 2017-01-13 NOTE — Progress Notes (Signed)
Pt ambulated 1000+ ft  in hallway using rolling walker.  Pt tolerated well.

## 2017-01-13 NOTE — Care Management Note (Signed)
Case Management Note  Patient Details  Name: [name] MRN: [id number] Date of Birth: 1945-07-07  Subjective/Objective:  From home, POD 2 CABG, Maze, and clipping of left atrial appendage,  GIB, conts with chest tube conts with mild volume excess cont to diurese, will plan to dc chest tubes , transfer to SDU.                   Action/Plan: NCM will follow for dc needs.   Expected Discharge Date:                  Expected Discharge Plan:     In-House Referral:     Discharge planning Services  CM Consult  Post Acute Care Choice:    Choice offered to:     DME Arranged:    DME Agency:     HH Arranged:    HH Agency:     Status of Service:  In process, will continue to follow  If discussed at Long Length of Stay Meetings, dates discussed:    Additional Comments:  Leone Havenaylor, Milicent Acheampong Clinton, RN 01/13/2017, 11:36 AM

## 2017-01-13 NOTE — Significant Event (Signed)
Patient transferred to 4E07 safely-ambulated there with RN. All personal belongings (clothes, shoes, red bag) taken to new room. Patient settled in chair per his requests. Report given to receiving RN Kathlene NovemberMike. Staff is aware patient is in room. Patient's family updated and made aware of new room number.     Stephen Crawford

## 2017-01-14 ENCOUNTER — Inpatient Hospital Stay (HOSPITAL_COMMUNITY): Payer: Medicare HMO

## 2017-01-14 LAB — TYPE AND SCREEN
ABO/RH(D): A POS
Antibody Screen: NEGATIVE
UNIT DIVISION: 0
UNIT DIVISION: 0
UNIT DIVISION: 0
UNIT DIVISION: 0
UNIT DIVISION: 0
Unit division: 0
Unit division: 0
Unit division: 0

## 2017-01-14 LAB — BASIC METABOLIC PANEL
Anion gap: 4 — ABNORMAL LOW (ref 5–15)
BUN: 20 mg/dL (ref 6–20)
CALCIUM: 8.1 mg/dL — AB (ref 8.9–10.3)
CHLORIDE: 105 mmol/L (ref 101–111)
CO2: 24 mmol/L (ref 22–32)
CREATININE: 1.07 mg/dL (ref 0.61–1.24)
GFR calc non Af Amer: >60 mL/min (ref >60)
GLUCOSE: 98 mg/dL (ref 65–99)
Potassium: 4.5 mmol/L (ref 3.5–5.1)
Sodium: 133 mmol/L — ABNORMAL LOW (ref 135–145)

## 2017-01-14 LAB — CBC
HEMATOCRIT: 23.5 % — AB (ref 39.0–52.0)
HEMOGLOBIN: 7.9 g/dL — AB (ref 13.0–17.0)
MCH: 31.9 pg (ref 26.0–34.0)
MCHC: 33.6 g/dL (ref 30.0–36.0)
MCV: 94.8 fL (ref 78.0–100.0)
Platelets: 175 10*3/uL (ref 150–400)
RBC: 2.48 MIL/uL — ABNORMAL LOW (ref 4.22–5.81)
RDW: 17 % — AB (ref 11.5–15.5)
WBC: 12.3 10*3/uL — ABNORMAL HIGH (ref 4.0–10.5)

## 2017-01-14 LAB — BPAM RBC
Blood Product Expiration Date: 201812062359
Blood Product Expiration Date: 201812062359
Blood Product Expiration Date: 201812062359
Blood Product Expiration Date: 201812062359
Blood Product Expiration Date: 201812072359
Blood Product Expiration Date: 201812072359
Blood Product Expiration Date: 201812072359
Blood Product Expiration Date: 201812072359
ISSUE DATE / TIME: 201811190643
ISSUE DATE / TIME: 201811190643
ISSUE DATE / TIME: 201811190643
ISSUE DATE / TIME: 201811190643
ISSUE DATE / TIME: 201811190854
ISSUE DATE / TIME: 201811190854
UNIT TYPE AND RH: 6200
UNIT TYPE AND RH: 6200
UNIT TYPE AND RH: 6200
UNIT TYPE AND RH: 6200
UNIT TYPE AND RH: 6200
Unit Type and Rh: 6200
Unit Type and Rh: 6200
Unit Type and Rh: 6200

## 2017-01-14 NOTE — Progress Notes (Addendum)
[address]       [place] [id number]             [phone]      3 Days Post-Op Procedure(s) (LRB): CORONARY ARTERY BYPASS GRAFTING (CABG), ON PUMP, TIMES ONE, USING LEFT INTERNAL MAMMARY ARTERY (N/A) MAZE (N/A) TRANSESOPHAGEAL ECHOCARDIOGRAM (TEE) (N/A) CLIPPING OF LEFT ATRIAL APPENDAGE Subjective: No specific c/o overall feels well  Objective: Vital signs in last 24 hours: Temp:  [98.1 F (36.7 C)-99.4 F (37.4 C)] 99.4 F (37.4 C) (11/22 0523) Pulse Rate:  [71-84] 82 (11/22 0523) Cardiac Rhythm: Normal sinus rhythm;Bundle branch block (11/21 1909) Resp:  [13-27] 20 (11/22 0523) BP: (102-138)/(57-90) 138/75 (11/22 0523) SpO2:  [92 %-100 %] 92 % (11/22 0523) FiO2 (%):  [21 %] 21 % (11/21 1550) Weight:  [167 lb 4.8 oz (75.9 kg)] 167 lb 4.8 oz (75.9 kg) (11/22 0523)  Hemodynamic parameters for last 24 hours:    Intake/Output from previous day: 11/21 0701 - 11/22 0700 In: 775 [P.O.:600; I.V.:75; IV Piggyback:100] Out: 1075 [Urine:1005; Chest Tube:70] Intake/Output this shift: No intake/output data recorded.  General appearance: alert, cooperative and no distress Heart: regular rate and rhythm Lungs: minor basilar crackles Abdomen: benign Extremities: no edema Wound: dressings CDI  Lab Results: Recent Labs    01/13/17 0354 01/14/17 0239  WBC 11.7* 12.3*  HGB 7.8* 7.9*  HCT 23.1* 23.5*  PLT 142* 175   BMET:  Recent Labs    01/13/17 0354 01/14/17 0239  NA 133* 133*  K 4.1 4.5  CL 105 105  CO2 24 24  GLUCOSE 95 98  BUN 20 20  CREATININE 1.13 1.07  CALCIUM 7.9* 8.1*    PT/INR:  Recent Labs    01/11/17 1333  LABPROT 16.7*  INR 1.37   ABG    Component Value Date/Time   PHART 7.369 01/11/2017 2206   HCO3 22.9 01/11/2017 2206   TCO2 22 01/12/2017 1652   ACIDBASEDEF 2.0 01/11/2017 2206   O2SAT 99.0 01/11/2017 2206   CBG (last 3)  Recent Labs    01/12/17 1144 01/13/17 0753 01/13/17 1129  GLUCAP 123* 91 108*     Meds Scheduled Meds: . acetaminophen  1,000 mg Oral Q6H   Or  . acetaminophen (TYLENOL) oral liquid 160 mg/5 mL  1,000 mg Per Tube Q6H  . aspirin EC  81 mg Oral Daily   Or  . aspirin  81 mg Per Tube Daily  . atorvastatin  80 mg Oral Daily  . Chlorhexidine Gluconate Cloth  6 each Topical Daily  . docusate sodium  200 mg Oral Daily  . escitalopram  10 mg Oral Daily  . ferrous fumarate-b12-vitamic C-folic acid  1 capsule Oral TID PC  . furosemide  40 mg Oral Daily  . metoprolol tartrate  12.5 mg Oral BID  . moving right along book   Does not apply Once  . mupirocin ointment  1 application Nasal BID  . pantoprazole  40 mg Oral Daily  . potassium chloride  20 mEq Oral BID  . sodium chloride flush  3 mL Intravenous Q12H  . sodium chloride flush  3 mL Intravenous Q12H   Continuous Infusions: . sodium chloride Stopped (01/13/17 0800)  . sodium chloride    . sodium chloride Stopped (01/13/17 0945)  . sodium chloride    . lactated ringers Stopped (01/11/17 1423)  . lactated ringers Stopped (01/12/17 1500)   PRN Meds:.sodium chloride, sodium chloride, [START ON 01/16/2017] acetaminophen,  bisacodyl **OR** bisacodyl, metoprolol tartrate, morphine injection, ondansetron **OR** ondansetron (ZOFRAN) IV, oxyCODONE, sodium chloride flush, sodium chloride flush, traMADol  Xrays Dg Chest Port 1 View  Result Date: 01/13/2017 CLINICAL DATA:  Status post coronary bypass grafting EXAM: PORTABLE CHEST 1 VIEW COMPARISON:  01/12/2017 FINDINGS: Cardiac shadow is stable. Postoperative changes are again seen. The Swan-Ganz catheter has been removed in the interval although the jugular sheath remains. Bilateral thoracostomy catheters and mediastinal drain are seen. No significant pneumothorax is noted. Patchy bibasilar atelectatic changes are noted left greater than right. IMPRESSION: Tubes and lines as described. Slight increase in bibasilar atelectasis. Electronically Signed   By: Alcide CleverMark  Lukens M.D.    On: 01/13/2017 08:41    Assessment/Plan: S/P Procedure(s) (LRB): CORONARY ARTERY BYPASS GRAFTING (CABG), ON PUMP, TIMES ONE, USING LEFT INTERNAL MAMMARY ARTERY (N/A) MAZE (N/A) TRANSESOPHAGEAL ECHOCARDIOGRAM (TEE) (N/A) CLIPPING OF LEFT ATRIAL APPENDAGE  1 doing very well 2 sinus rhythm with occ PVC's, no afib, BP well controlled- monitor, may be able to start ACE-ARB in near future but currently BP too low 3 tmax 99.4, minor leukocytosis, monitor, prob mild systemic inflammatory response 4 minor hyponatremia, cont diuresis for now with weight up approx 7 lbs from preop but approaching euvolemia clinically. Renal fxn is normal 5 anemia is stable- not in transfusion zone but cont to monitor with recent GI bleed- Iron, plan plavix restart at d/c 6 sugars fine, no DM- A1C <5 7 routine pulm toilet and cardiac rehab  LOS: 12 days    Stephen Crawford 01/14/2017   I have seen and examined the patient and agree with the assessment and plan as outlined.  Stephen Nailslarence H Jolleen Seman, MD 01/14/2017 1:14 PM

## 2017-01-15 MED ORDER — ACETAMINOPHEN 325 MG PO TABS: 650.0000 mg | ORAL_TABLET | Freq: Four times a day (QID) | ORAL | Status: AC | PRN

## 2017-01-15 MED ORDER — LISINOPRIL 10 MG PO TABS: 10.0000 mg | ORAL_TABLET | Freq: Every day | ORAL | 3 refills | Status: AC

## 2017-01-15 MED ORDER — OXYCODONE HCL 5 MG PO TABS: 5.0000 mg | ORAL_TABLET | ORAL | 0 refills | Status: AC | PRN

## 2017-01-15 MED ORDER — LISINOPRIL 10 MG PO TABS
10.0000 mg | ORAL_TABLET | Freq: Every day | ORAL | Status: DC
  Administered 2017-01-15: 10 mg via ORAL
  Filled 2017-01-15: qty 1

## 2017-01-15 MED ORDER — METOPROLOL TARTRATE 25 MG PO TABS
25.0000 mg | ORAL_TABLET | Freq: Two times a day (BID) | ORAL | Status: DC
  Administered 2017-01-15: 25 mg via ORAL
  Filled 2017-01-15: qty 1

## 2017-01-15 NOTE — Progress Notes (Signed)
I am covering for Trish; received request for f/u appt from [facility]. Office is closed. Will forward request to scheduling. Our office will call the patient with this information. Dayna Dunn PA-C

## 2017-01-15 NOTE — Care Management Note (Signed)
Case Management Note Original Note Created Leone Havenaylor, Deborah Clinton, RN 01/13/2017, 11:36 AM   Patient Details  Name: Stephen Crawford MRN: [id number] Date of Birth: [birth year]  Subjective/Objective:  From home, POD 2 CABG, Maze, and clipping of left atrial appendage,  GIB, conts with chest tube conts with mild volume excess cont to diurese, will plan to dc chest tubes , transfer to SDU.                   Action/Plan: NCM will follow for dc needs.   Expected Discharge Date:  01/15/17               Expected Discharge Plan:  Home/Self Care  In-House Referral:  NA  Discharge planning Services  CM Consult  Post Acute Care Choice:  NA Choice offered to:  NA  DME Arranged:    DME Agency:     HH Arranged:    HH Agency:     Status of Service:  Completed, signed off  If discussed at Long Length of Stay Meetings, dates discussed:     Discharge Disposition: home/self care  Additional Comments:  01/15/17- 1220- Stephen Mcconathy RN, CM- pt for d/c home today- no CM needs noted for discharge.   Zenda AlpersWebster, Stephen Hall, RN 01/15/2017, 12:21 PM [phone]

## 2017-01-15 NOTE — Progress Notes (Addendum)
[address]       [place] [id number]             [phone]      4 Days Post-Op Procedure(s) (LRB): CORONARY ARTERY BYPASS GRAFTING (CABG), ON PUMP, TIMES ONE, USING LEFT INTERNAL MAMMARY ARTERY (N/A) MAZE (N/A) TRANSESOPHAGEAL ECHOCARDIOGRAM (TEE) (N/A) CLIPPING OF LEFT ATRIAL APPENDAGE Subjective: Feels well  Objective: Vital signs in last 24 hours: Temp:  [98.3 F (36.8 C)-99.1 F (37.3 C)] 98.9 F (37.2 C) (11/23 0449) Pulse Rate:  [80-89] 87 (11/23 0449) Cardiac Rhythm: Normal sinus rhythm;Bundle branch block (11/22 1923) Resp:  [16-23] 16 (11/23 0449) BP: (118-141)/(65-79) 141/79 (11/23 0449) SpO2:  [98 %] 98 % (11/23 0449) Weight:  [164 lb 3.2 oz (74.5 kg)] 164 lb 3.2 oz (74.5 kg) (11/23 0449)  Hemodynamic parameters for last 24 hours:    Intake/Output from previous day: 11/22 0701 - 11/23 0700 In: 240 [P.O.:240] Out: 1550 [Urine:1550] Intake/Output this shift: No intake/output data recorded.  General appearance: alert, cooperative and no distress Heart: regular rate and rhythm Lungs: mildly dim in bases Abdomen: benign Extremities: trace edema Wound: incis healing well  Lab Results: Recent Labs    01/13/17 0354 01/14/17 0239  WBC 11.7* 12.3*  HGB 7.8* 7.9*  HCT 23.1* 23.5*  PLT 142* 175   BMET:  Recent Labs    01/13/17 0354 01/14/17 0239  NA 133* 133*  K 4.1 4.5  CL 105 105  CO2 24 24  GLUCOSE 95 98  BUN 20 20  CREATININE 1.13 1.07  CALCIUM 7.9* 8.1*    PT/INR: No results for input(s): LABPROT, INR in the last 72 hours. ABG    Component Value Date/Time   PHART 7.369 01/11/2017 2206   HCO3 22.9 01/11/2017 2206   TCO2 22 01/12/2017 1652   ACIDBASEDEF 2.0 01/11/2017 2206   O2SAT 99.0 01/11/2017 2206   CBG (last 3)  Recent Labs    01/12/17 1144 01/13/17 0753 01/13/17 1129  GLUCAP 123* 91 108*    Meds Scheduled Meds: . acetaminophen  1,000 mg Oral Q6H   Or  . acetaminophen (TYLENOL) oral liquid  160 mg/5 mL  1,000 mg Per Tube Q6H  . aspirin EC  81 mg Oral Daily   Or  . aspirin  81 mg Per Tube Daily  . atorvastatin  80 mg Oral Daily  . Chlorhexidine Gluconate Cloth  6 each Topical Daily  . docusate sodium  200 mg Oral Daily  . escitalopram  10 mg Oral Daily  . ferrous fumarate-b12-vitamic C-folic acid  1 capsule Oral TID PC  . furosemide  40 mg Oral Daily  . metoprolol tartrate  12.5 mg Oral BID  . moving right along book   Does not apply Once  . mupirocin ointment  1 application Nasal BID  . pantoprazole  40 mg Oral Daily  . potassium chloride  20 mEq Oral BID  . sodium chloride flush  3 mL Intravenous Q12H  . sodium chloride flush  3 mL Intravenous Q12H   Continuous Infusions: . sodium chloride Stopped (01/13/17 0800)  . sodium chloride    . sodium chloride Stopped (01/13/17 0945)  . sodium chloride    . lactated ringers Stopped (01/11/17 1423)  . lactated ringers Stopped (01/12/17 1500)   PRN Meds:.sodium chloride, sodium chloride, [START ON 01/16/2017] acetaminophen, bisacodyl **OR** bisacodyl, metoprolol tartrate, morphine injection, ondansetron **OR** ondansetron (ZOFRAN) IV, oxyCODONE, sodium chloride flush, sodium chloride flush, traMADol  Xrays  Dg Chest 2 View  Result Date: 01/14/2017 CLINICAL DATA:  Status post CABG EXAM: CHEST  2 VIEW COMPARISON:  01/13/2017 FINDINGS: Normal heart size. Right jugular introducer sheath removed. Bilateral chest tubes removed. No pneumothorax. Bibasilar volume loss has improved. Mild vascular congestion is stable. No sign of interstitial edema. IMPRESSION: Chest tubes removed without pneumothorax. Improving bibasilar volume loss. No sign of CHF. Electronically Signed   By: Jolaine ClickArthur  Hoss M.D.   On: 01/14/2017 07:58    Assessment/Plan: S/P Procedure(s) (LRB): CORONARY ARTERY BYPASS GRAFTING (CABG), ON PUMP, TIMES ONE, USING LEFT INTERNAL MAMMARY ARTERY (N/A) MAZE (N/A) TRANSESOPHAGEAL ECHOCARDIOGRAM (TEE) (N/A) CLIPPING OF LEFT  ATRIAL APPENDAGE  1 doing well 2 d/c epw's this am 3 Rhythm stable with BBB 4 BP up from previous values - will add low dose ACE-I 5 discharge this afternoon after observation period following epw's removed    LOS: 13 days    Stephen Crawford 01/15/2017   Chart reviewed, patient examined, agree with above. He is doing well. No arrhythmias postop. Will increase Lopressor to 25 bid with preop atrial fib. He can go home later today.

## 2017-01-15 NOTE — Discharge Instructions (Signed)

## 2017-01-15 NOTE — Progress Notes (Signed)
Cardiac Rehab [phone] Pt has been walking independently, denies any difficulties. Completed discharge education with pt. We discussed sternal precautions, use of IS, heart healthy diet, exercise guidelines and Outpt. CRP. He voices understanding. Will send referral to Outpt. CRP in [place]. Encouraged him to watch. Recovery from heart surgery video.

## 2017-01-15 NOTE — Discharge Summary (Signed)
Physician Discharge Summary  Patient ID: Stephen Crawford MRN: [id number] DOB/AGE: Apr 11, 1945 71 y.o.  Admit date: 01/02/2017 Discharge date: 01/15/2017  Admission Diagnoses:  Patient Active Problem List   Diagnosis Date Noted  . Non-ST elevation (NSTEMI) myocardial infarction (HCC) 01/05/2017  . Acute blood loss anemia 01/05/2017  . Paroxysmal atrial fibrillation with RVR (HCC) 01/02/2017  . Syncope and collapse 01/02/2017  . CAD/stents 01/02/2017  . HTN (hypertension) 01/02/2017   Discharge Diagnoses:   Patient Active Problem List   Diagnosis Date Noted  . S/P CABG x 1 01/11/2017  . Non-ST elevation (NSTEMI) myocardial infarction (HCC) 01/05/2017  . Acute blood loss anemia 01/05/2017  . Paroxysmal atrial fibrillation with RVR (HCC) 01/02/2017  . Syncope and collapse 01/02/2017  . CAD/stents 01/02/2017  . HTN (hypertension) 01/02/2017   Discharged Condition: good  History of Present Illness:  [name] is a 71 yo white male with known history of CAD S/P MI in 2015.  This occurred after undergoing a total knee replacement in 2015.  At that time he had a stent placed to his LAD and a subsequent stent placed to the left circumflex a few weeks later.  The patient has done well since that time.  His wife pasted in December of 2017 and since that time he began going to the gym and has lost 50 lbs.  He states that he never had chest pressure of shortness of breath at the gym.  However while visiting a friend in [place] he got up to use the restroom and suffered a syncopal episode.  He his is face on the bath tub at the time.  His friend found him and got him up into a chair.  However he got up to go to the bathroom again, and again suffered a syncopal episode.  His friend contacted his daughter who instructed them to call EMS.  In the ER he was found to be in Atrial Fibrillation with RVR.  He was treated with Cardizem drip, but was also ruled in for NSTEMI.  Echocardiogram showed normal EF  but the was some mild diastolic dysfunction.  There was no valvular disease noted.  CT scan of the head and neck were negative for acute injuries from his fall.  He was started on Eliquis for stroke prophlaxis.  However, he developed GI bleeding and this had to be stopped.  Colonoscopy was performed and was negative for bleeding sites.  EGD found non-bleeding gastric erosions.  Once recovered from GI bleeding he underwent cardiac catheterization on 01/09/2017.  This showed a 90 % osital LAD lesion, with his previously placed stents being patent.  It was felt he would require surgical intervention and TCTS consult was requested.    Hospital Course:   He was evaluated by Dr. Laneta SimmersBartle who was in agreement the patient would benefit from coronary bypass procedure and MAZE procedure.  The risks and benefits of the procedure were explained to the patient and he was agreeable to proceed.  He was taken to the operating room on 01/11/2017.  He underwent CABG x 1 utilizing LIMA to LAD, Complete MAZE procedure, and clipping of his LA appendage.  He tolerated the procedure without difficulty, was extubated and taken to the SICU in stable condition.  He was extubated the evening of surgery.  During his stay in the SICU he was weaned off Levophed as tolerated.  His arterial line and SWAN were removed on POD #1.  His chest tubes were removed on POD #2.  He maintained NSR post operatively.  He was started on lopressor on POD #2.  He would normally be started on coumadin post MAZE procedure, but due to previous GI bleeding this will not be done.  He was restarted on his home ASA and Plavix.  He was started on low dose lasix for hypervolemia.  He was ambulating without difficulty and was transferred to the telemetry unit on 01/13/2017.  The patient continued to make good progress.  He remained in NSR with occasional PVCs.  His pacing wires were removed without difficulty.  He had some mild post operative anemia and was started on  iron supplementation for this.  His blood pressure trended upward and he was started on low dose ACE for this.  He was ambulating independently.  He was tolerating a heart healthy diet.  He was felt medically stable for discharge home today.         Significant Diagnostic Studies: angiography:    Ost LAD lesion is 90% stenosed.  Previously placed Mid LAD stent (unknown type) is widely patent.  Ost Ramus lesion is 90% stenosed. This is a small vessel.  Ost RCA to Prox RCA lesion is 20% stenosed.  Previously placed Mid Cx stent (unknown type) is widely patent.  LV end diastolic pressure is normal.  There is no aortic valve stenosis.   Ostial LAD disease in a patient with recent GI bleeding while on Plavix, and new onset AFib which may require longterm anticoagulation.    COnsider surgical referral for LIMA to LAD, and possible surgical management of AFib including oversewing of Left atrial appendage and possibly Maze procedure.    Antiplatelet therapy plus anticoagulation would be high risk for bleeding long term.    Treatments: surgery:    Procedure:  1. Median Sternotomy 2. Extracorporeal circulation 3.   Coronary artery bypass grafting x 1   Left internal mammary graft to the LAD   4.   Maze IV: complete bi-atrial lesion set using cryo-thermy and radiofrequency clamp. 5.  Clipping of left atrial appendage  Disposition: Home  Discharge Medications:  The patient has been discharged on:   1.Beta Blocker:  Yes [ x  ]                              No   [   ]                              If No, reason:  2.Ace Inhibitor/ARB: Yes [ x  ]                                     No  [    ]                                     If No, reason:  3.Statin:   Yes [  x ]                  No  [   ]                  If No, reason:  4.Ecasa:  Yes  [ x  ]  No   [   ]                  If No, reason:  Discharge Instructions    Amb Referral to Cardiac  Rehabilitation   Complete by:  As directed    Referral to [facility]. CRP.   Diagnosis:   CABG NSTEMI     CABG X ___:  1     Allergies as of 01/15/2017   No Known Allergies     Medication List    STOP taking these medications   amLODipine 5 MG tablet Commonly known as:  NORVASC     TAKE these medications   acetaminophen 325 MG tablet Commonly known as:  TYLENOL Take 2 tablets (650 mg total) by mouth every 6 (six) hours as needed for mild pain. Start taking on:  01/16/2017   aspirin EC 81 MG tablet Take 81 mg daily by mouth.   atorvastatin 20 MG tablet Commonly known as:  LIPITOR Take 20 mg daily by mouth.   clopidogrel 75 MG tablet Commonly known as:  PLAVIX Take 75 mg daily by mouth.   escitalopram 10 MG tablet Commonly known as:  LEXAPRO Take 10 mg daily by mouth.   lisinopril 10 MG tablet Commonly known as:  PRINIVIL,ZESTRIL Take 1 tablet (10 mg total) by mouth daily. Start taking on:  01/16/2017   meloxicam 7.5 MG tablet Commonly known as:  MOBIC Take 7.5 mg daily by mouth.   metoprolol tartrate 25 MG tablet Commonly known as:  LOPRESSOR Take 25 mg 2 (two) times daily by mouth.   oxyCODONE 5 MG immediate release tablet Commonly known as:  Oxy IR/ROXICODONE Take 1-2 tablets (5-10 mg total) by mouth every 3 (three) hours as needed for severe pain.   traZODone 150 MG tablet Commonly known as:  DESYREL Take 75 mg daily as needed by mouth for sleep.      Follow-up Information    [facility] Follow up on 01/22/2017.   Specialty:  Cardiothoracic Surgery Why:  Appointment is at 10:00 wtih nurse for suture removal Contact information: [address] [place] [id number] [phone]       Alleen BorneBartle, Bryan K, MD Follow up on 01/18/2017.   Specialty:  Cardiothoracic Surgery Why:  Appointment is at 1:30, please get CXR at 1:00 at [facility] located on first floor of  our office building Contact information: [address] [place] [address] [phone]           Signed: Lowella Dandyrin Andros Channing 01/15/2017, 11:55 AM

## 2017-01-26 NOTE — Progress Notes (Deleted)
Cardiology Office Note    Date:  01/26/2017   ID:  Stephen Crawford, DOB Sep 28, 1945, MRN [id number]  PCP:  System, Pcp Not In  Cardiologist: Dr. Rennis Golden   No chief complaint on file.   History of Present Illness:    Stephen Crawford is a 72 y.o. male with past medical hisotry of CAD (s/p MI in 2015), HTN, and HLD who presents to the office today for hospital follow-up.   He was recently admitted to [facility] from 11/10 - 01/15/2017 for evaluation of syncope, found to be in atrial fibrillation with RVR upon arrival to the ED. He was started on Eliquis 5 mg twice daily for anticoagulation but was noted to have a significant drop in hemoglobin during admission with this dropping from 13.3 down to 7.9 on 01/03/2017.  He reported episodes of chest discomfort around that same time and cyclic troponin values were checked and found to be elevated at 2.84.  Prior to undergo an ischemic evaluation, GI was consulted and he underwent an EGD which showed nonbleeding gastric erosions.  Colonoscopy was performed on 01/06/2017 and showed specific sigmoid diverticulosis and internal hemorrhoids but no evidence of active bleeding.  A cardiac catheterization was obtained on 01/08/2017 and showed 90% ostial LAD stenosis and 90% ostial ramus stenosis which was a small vessel.  Due to his significant LAD disease, CT surgery was consulted for consideration of CABG and possible Maze procedure.  He underwent CABG on 01/11/2017  with LIMA to LAD along with Maze procedure and clipping of the left atrial appendage.    Past Medical History:  Diagnosis Date  . CAD (coronary artery disease) 01/02/2017    Past Surgical History:  Procedure Laterality Date  . CLIPPING OF ATRIAL APPENDAGE  01/11/2017   Procedure: CLIPPING OF LEFT ATRIAL APPENDAGE;  Surgeon: Alleen Borne, MD;  Location: [facility] OR;  Service: Open Heart Surgery;;  . COLONOSCOPY WITH PROPOFOL N/A 01/06/2017   Procedure: COLONOSCOPY WITH PROPOFOL;  Surgeon:  Kathi Der, MD;  Location: [facility] ENDOSCOPY;  Service: Gastroenterology;  Laterality: N/A;  . CORONARY ANGIOPLASTY N/A 01/02/2017  . CORONARY ARTERY BYPASS GRAFT N/A 01/11/2017   Procedure: CORONARY ARTERY BYPASS GRAFTING (CABG), ON PUMP, TIMES ONE, USING LEFT INTERNAL MAMMARY ARTERY;  Surgeon: Alleen Borne, MD;  Location: [facility] OR;  Service: Open Heart Surgery;  Laterality: N/A;  LIMA to LAD  . ESOPHAGOGASTRODUODENOSCOPY (EGD) WITH PROPOFOL N/A 01/05/2017   Procedure: ESOPHAGOGASTRODUODENOSCOPY (EGD) WITH PROPOFOL;  Surgeon: Kathi Der, MD;  Location: [facility] ENDOSCOPY;  Service: Gastroenterology;  Laterality: N/A;  . LEFT HEART CATH AND CORONARY ANGIOGRAPHY N/A 01/08/2017   Procedure: LEFT HEART CATH AND CORONARY ANGIOGRAPHY;  Surgeon: Corky Crafts, MD;  [facility] INVASIVE CV LAB;  Service: Cardiovascular;  Laterality: N/A;  . MAZE N/A 01/11/2017   Procedure: MAZE;  Surgeon: Alleen Borne, MD;  Location: [facility] OR;  Service: Open Heart Surgery;  Laterality: N/A;  Complete Bi-Atrial Lesion Set using Radio frequency ablation, cyrothermy  . TEE WITHOUT CARDIOVERSION N/A 01/11/2017   Procedure: TRANSESOPHAGEAL ECHOCARDIOGRAM (TEE);  Surgeon: Alleen Borne, MD;  Location: [facility] OR;  Service: Open Heart Surgery;  Laterality: N/A;    Current Medications: Outpatient Medications Prior to Visit  Medication Sig Dispense Refill  . acetaminophen (TYLENOL) 325 MG tablet Take 2 tablets (650 mg total) by mouth every 6 (six) hours as needed for mild pain.    Marland Kitchen aspirin EC 81 MG tablet Take 81 mg daily by mouth.    Marland Kitchen  atorvastatin (LIPITOR) 20 MG tablet Take 20 mg daily by mouth.    . clopidogrel (PLAVIX) 75 MG tablet Take 75 mg daily by mouth.    . escitalopram (LEXAPRO) 10 MG tablet Take 10 mg daily by mouth.    Marland Kitchen. lisinopril (PRINIVIL,ZESTRIL) 10 MG tablet Take 1 tablet (10 mg total) by mouth daily. 30 tablet 3  . meloxicam (MOBIC) 7.5 MG tablet Take 7.5 mg daily by mouth.    . metoprolol  tartrate (LOPRESSOR) 25 MG tablet Take 25 mg 2 (two) times daily by mouth.    . oxyCODONE (OXY IR/ROXICODONE) 5 MG immediate release tablet Take 1-2 tablets (5-10 mg total) by mouth every 3 (three) hours as needed for severe pain. 30 tablet 0  . traZODone (DESYREL) 150 MG tablet Take 75 mg daily as needed by mouth for sleep.      No facility-administered medications prior to visit.      Allergies:   Patient has no known allergies.   Social History   Socioeconomic History  . Marital status: Widowed    Spouse name: Not on file  . Number of children: Not on file  . Years of education: Not on file  . Highest education level: Not on file  Social Needs  . Financial resource strain: Not on file  . Food insecurity - worry: Not on file  . Food insecurity - inability: Not on file  . Transportation needs - medical: Not on file  . Transportation needs - non-medical: Not on file  Occupational History  . Not on file  Tobacco Use  . Smoking status: Never Smoker  . Smokeless tobacco: Never Used  . Tobacco comment: not currently smoking  Substance and Sexual Activity  . Alcohol use: No    Frequency: Never  . Drug use: No  . Sexual activity: Not on file  Other Topics Concern  . Not on file  Social History Narrative  . Not on file     Family History:  The patient's ***family history includes Hypertension in his father.   Review of Systems:   Please see the history of present illness.     General:  No chills, fever, night sweats or weight changes.  Cardiovascular:  No chest pain, dyspnea on exertion, edema, orthopnea, palpitations, paroxysmal nocturnal dyspnea. Dermatological: No rash, lesions/masses Respiratory: No cough, dyspnea Urologic: No hematuria, dysuria Abdominal:   No nausea, vomiting, diarrhea, bright red blood per rectum, melena, or hematemesis Neurologic:  No visual changes, wkns, changes in mental status. All other systems reviewed and are otherwise negative except as  noted above.   Physical Exam:    VS:  There were no vitals taken for this visit.   General: Well developed, well nourished,male appearing in no acute distress. Head: Normocephalic, atraumatic, sclera non-icteric, no xanthomas, nares are without discharge.  Neck: No carotid bruits. JVD not elevated.  Lungs: Respirations regular and unlabored, without wheezes or rales.  Heart: ***Regular rate and rhythm. No S3 or S4.  No murmur, no rubs, or gallops appreciated. Abdomen: Soft, non-tender, non-distended with normoactive bowel sounds. No hepatomegaly. No rebound/guarding. No obvious abdominal masses. Msk:  Strength and tone appear normal for age. No joint deformities or effusions. Extremities: No clubbing or cyanosis. No edema.  Distal pedal pulses are 2+ bilaterally. Neuro: Alert and oriented X 3. Moves all extremities spontaneously. No focal deficits noted. Psych:  Responds to questions appropriately with a normal affect. Skin: No rashes or lesions noted  Wt Readings from Last 3  Encounters:  01/15/17 164 lb 3.2 oz (74.5 kg)        Studies/Labs Reviewed:   EKG:  EKG is*** ordered today.  The ekg ordered today demonstrates ***  Recent Labs: 01/02/2017: TSH 0.412 01/10/2017: ALT 20 01/12/2017: Magnesium 2.1 01/14/2017: BUN 20; Creatinine, Ser 1.07; Hemoglobin 7.9; Platelets 175; Potassium 4.5; Sodium 133   Lipid Panel    Component Value Date/Time   CHOL 73 01/06/2017 0355   TRIG 45 01/06/2017 0355   HDL 23 (L) 01/06/2017 0355   CHOLHDL 3.2 01/06/2017 0355   VLDL 9 01/06/2017 0355   LDLCALC 41 01/06/2017 0355    Additional studies/ records that were reviewed today include:  ***  Assessment:    No diagnosis found.   Plan:   In order of problems listed above:  1. ***    Medication Adjustments/Labs and Tests Ordered: Current medicines are reviewed at length with the patient today.  Concerns regarding medicines are outlined above.  Medication changes, Labs and  Tests ordered today are listed in the Patient Instructions below. There are no Patient Instructions on file for this visit.   Signed, Ellsworth LennoxBrittany M Strader, PA-C  01/26/2017 9:15 PM    [facility] [address] [place]  [address] Phone: [phone]; Fax: [phone]  [address] [place] [address] Phone: [phone]

## 2017-01-27 ENCOUNTER — Encounter: Payer: Self-pay | Admitting: *Deleted

## 2017-01-27 ENCOUNTER — Ambulatory Visit: Payer: Medicare HMO | Admitting: Student

## 2017-02-17 ENCOUNTER — Ambulatory Visit (INDEPENDENT_AMBULATORY_CARE_PROVIDER_SITE_OTHER): Payer: Self-pay | Admitting: Surgery

## 2017-02-17 ENCOUNTER — Other Ambulatory Visit: Payer: Self-pay

## 2017-02-17 ENCOUNTER — Encounter: Payer: Self-pay | Admitting: Surgery

## 2017-02-17 ENCOUNTER — Ambulatory Visit
Admission: RE | Admit: 2017-02-17 | Discharge: 2017-02-17 | Disposition: A | Discharge status: Home / Self Care | Payer: Medicare HMO | Source: Ambulatory Visit | Attending: Physician Assistant | Admitting: Physician Assistant

## 2017-02-17 VITALS — BP 121/77 | HR 102 | Resp 18 | Ht 69.0 in | Wt 157.2 lb

## 2017-02-17 DIAGNOSIS — Z951 Presence of aortocoronary bypass graft: Secondary | ICD-10-CM

## 2017-02-21 ENCOUNTER — Encounter: Payer: Self-pay | Admitting: Surgery

## 2017-02-21 NOTE — Progress Notes (Signed)
     HPI: Patient returns for routine postoperative follow-up having undergone coronary artery bypass graft surgery with a left internal mammary artery graft to the LAD, Maze procedure, and clipping of the left atrial appendage on 01/11/2017. The patient's early postoperative recovery while in the hospital was notable for an uncomplicated postoperative course. Since hospital discharge the patient reports that he has been feeling well and is walking daily without chest pain or shortness of breath.  He has not noted any palpitations.   Current Outpatient Medications  Medication Sig Dispense Refill  . acetaminophen (TYLENOL) 325 MG tablet Take 2 tablets (650 mg total) by mouth every 6 (six) hours as needed for mild pain.    Marland Kitchen. aspirin EC 81 MG tablet Take 81 mg daily by mouth.    Marland Kitchen. atorvastatin (LIPITOR) 20 MG tablet Take 20 mg daily by mouth.    . escitalopram (LEXAPRO) 10 MG tablet Take 10 mg daily by mouth.    Marland Kitchen. lisinopril (PRINIVIL,ZESTRIL) 10 MG tablet Take 1 tablet (10 mg total) by mouth daily. 30 tablet 3  . meloxicam (MOBIC) 7.5 MG tablet Take 7.5 mg daily by mouth.    . metoprolol tartrate (LOPRESSOR) 25 MG tablet Take 25 mg 2 (two) times daily by mouth.    . oxyCODONE (OXY IR/ROXICODONE) 5 MG immediate release tablet Take 1-2 tablets (5-10 mg total) by mouth every 3 (three) hours as needed for severe pain. 30 tablet 0  . traZODone (DESYREL) 150 MG tablet Take 75 mg daily as needed by mouth for sleep.     . ferrous sulfate 325 (65 FE) MG tablet Take by mouth.     No current facility-administered medications for this visit.     Physical Exam: BP 121/77 (BP Location: Right Arm, Patient Position: Sitting, Cuff Size: Normal)   Pulse (!) 102   Resp 18   Ht 5\' 9"  (1.753 m)   Wt 157 lb 3.2 oz (71.3 kg)   SpO2 97% Comment: on RA  BMI 23.21 kg/m  He looks well Lungs are clear Cardiac exam shows a regular rate and rhythm with normal heart sounds. The chest incision is healing well and  the sternum is stable. There is no peripheral edema.   Diagnostic Tests:  A rhythm strip today shows sinus rhythm with a rate of 100.  Impression:  Overall I think he is doing well. I encouraged him to continue walking. He is planning to participate in cardiac rehab. I told him he could drive his car but should not lift anything heavier than 10 lbs for three months postop.  He is going to follow[place] cardiologist in [place] and will return to see me if he has any problems with his incisions.   Plan:  He will follow[place] cardi[place] in [place].   Alleen BorneBryan K Bartle, MD [facility] [phone]

## 2018-11-12 IMAGING — DX DG CHEST 2V
4 series · 4 of 4 positions shown · non-contrast
Comparison: January 02, 2017

CLINICAL DATA: Coronary artery disease with history of MI.

EXAM:
CHEST  2 VIEW

[chest lat (1 of 2)]
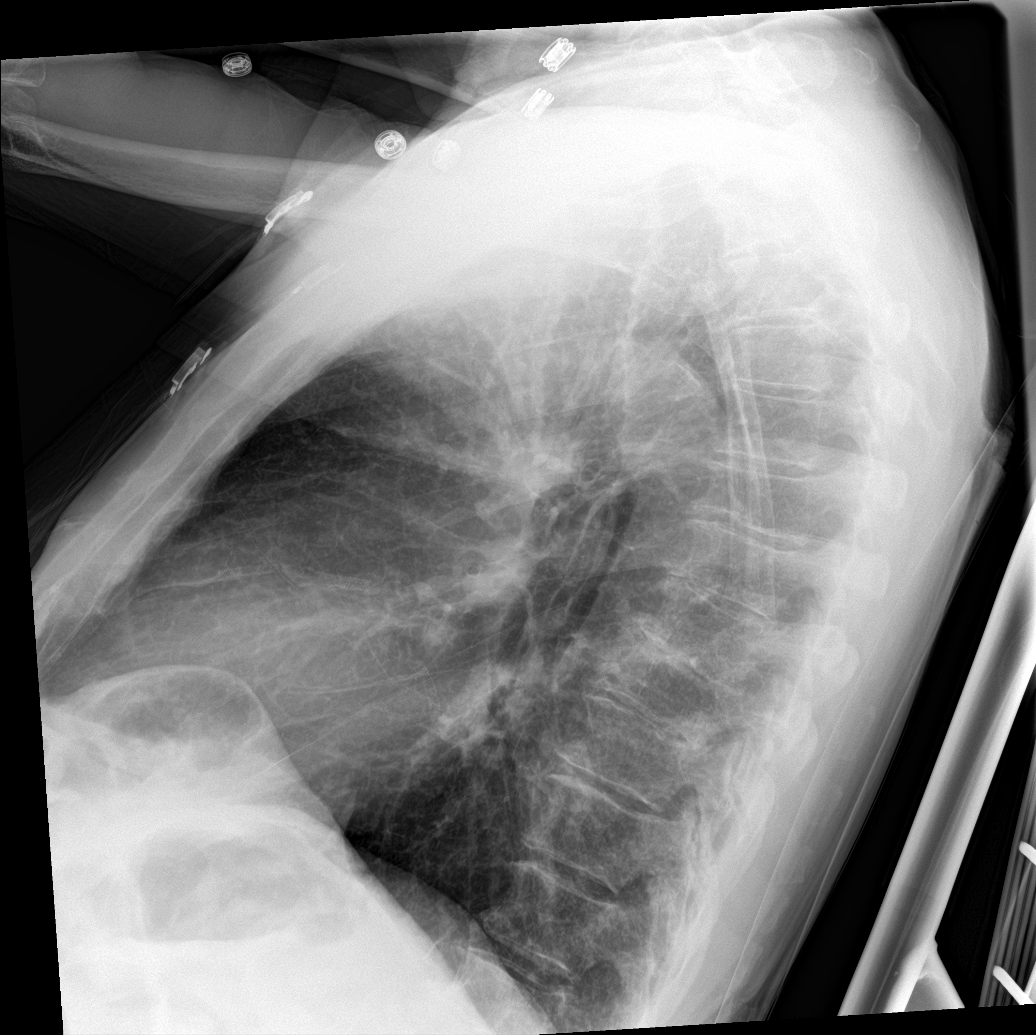

[chest ap (1 of 2)]
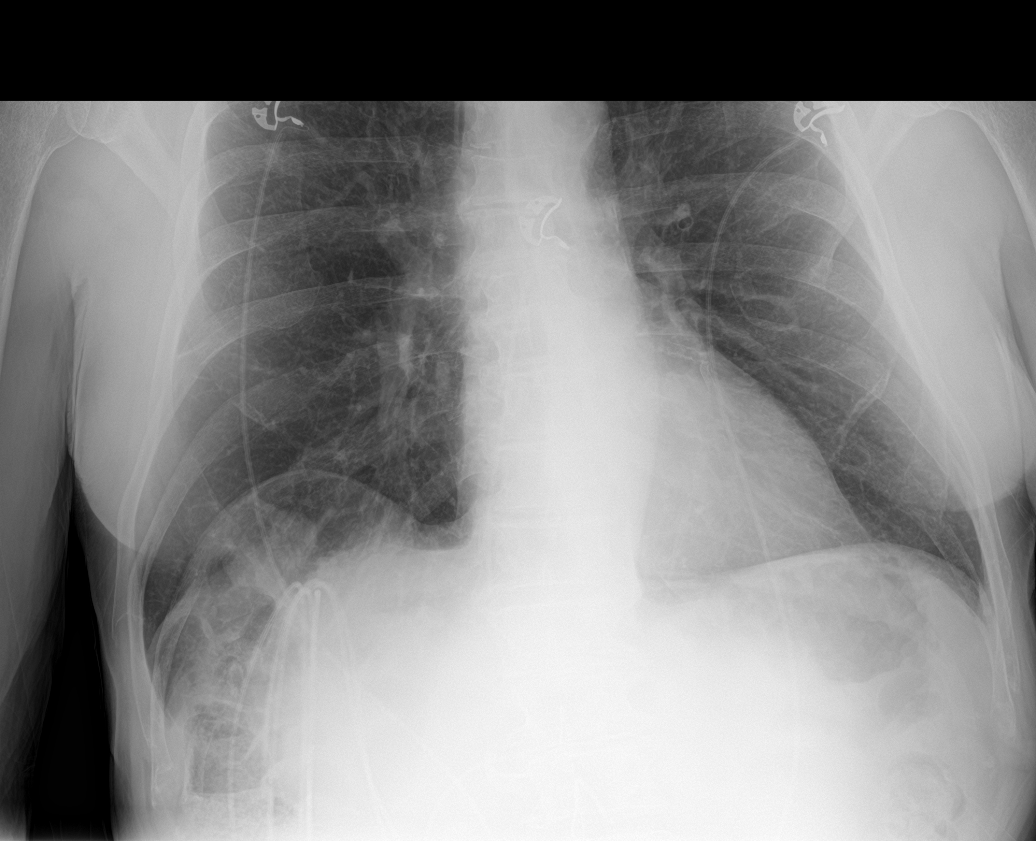

[chest ap (2 of 2)]
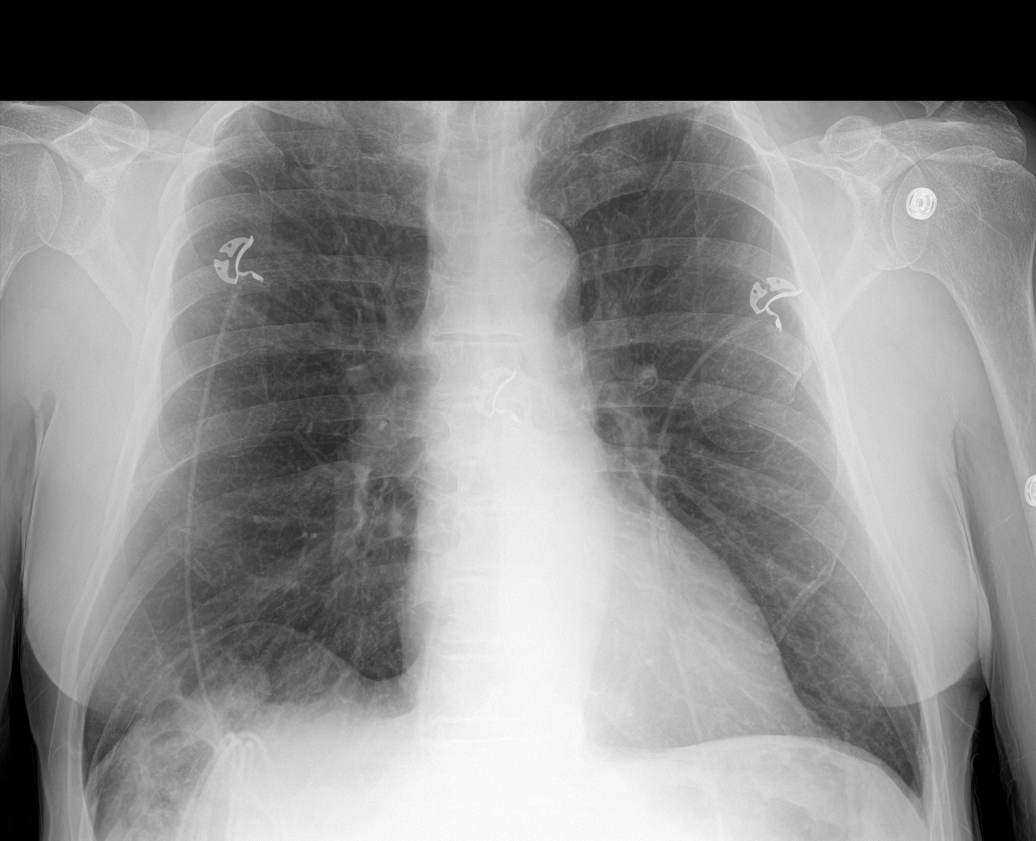

[chest lat (2 of 2)]
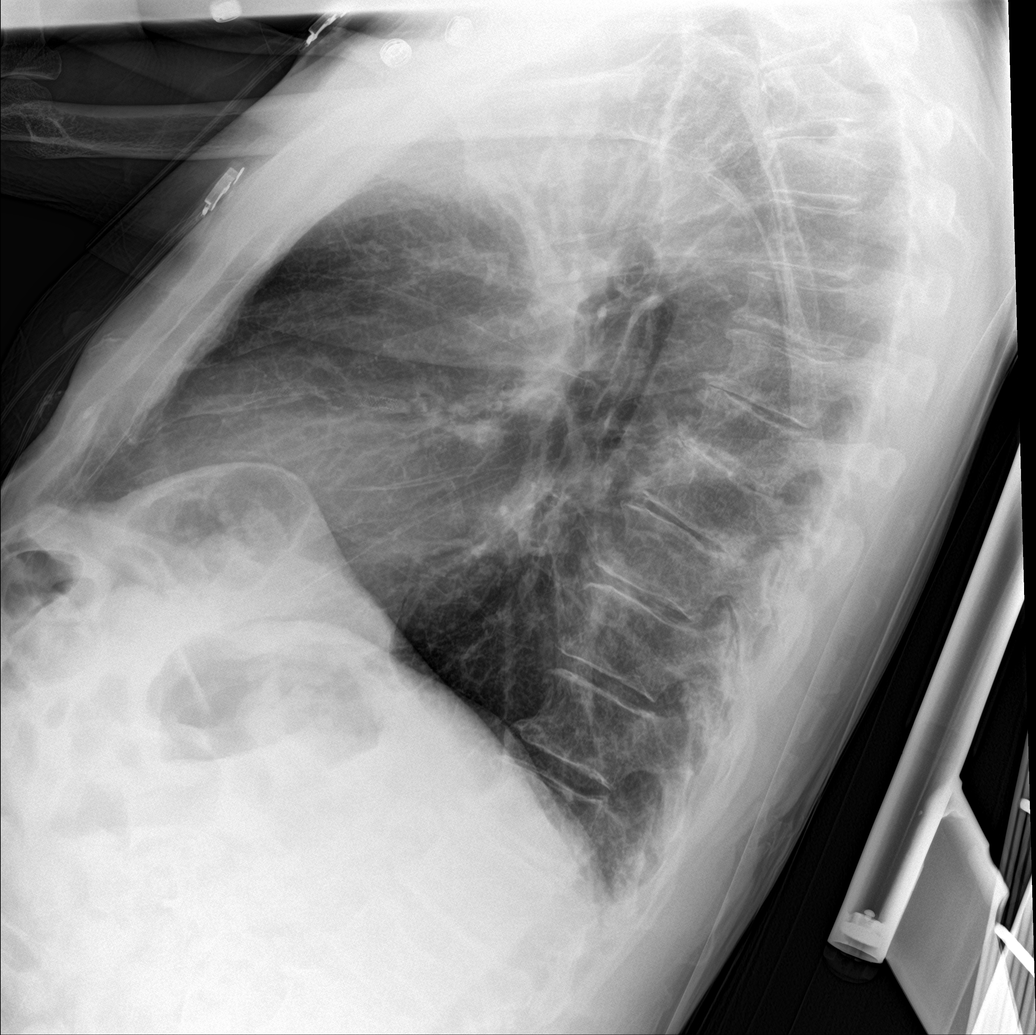

[4 of 4 positions shown; findings below may reference images not displayed]

FINDINGS: No pneumothorax. The heart, hila, and mediastinum are normal. No
nodules or masses. No focal infiltrates. No overt edema. Air under
the right hemidiaphragm is within bowel, unchanged. No other acute
abnormalities are seen.
IMPRESSION: No active cardiopulmonary disease.

## 2018-11-13 IMAGING — DX DG CHEST 1V PORT
1 series · 1 of 1 positions shown · non-contrast
Comparison: 01/10/2017

CLINICAL DATA: CABG

EXAM:
PORTABLE CHEST 1 VIEW

[chest ap]
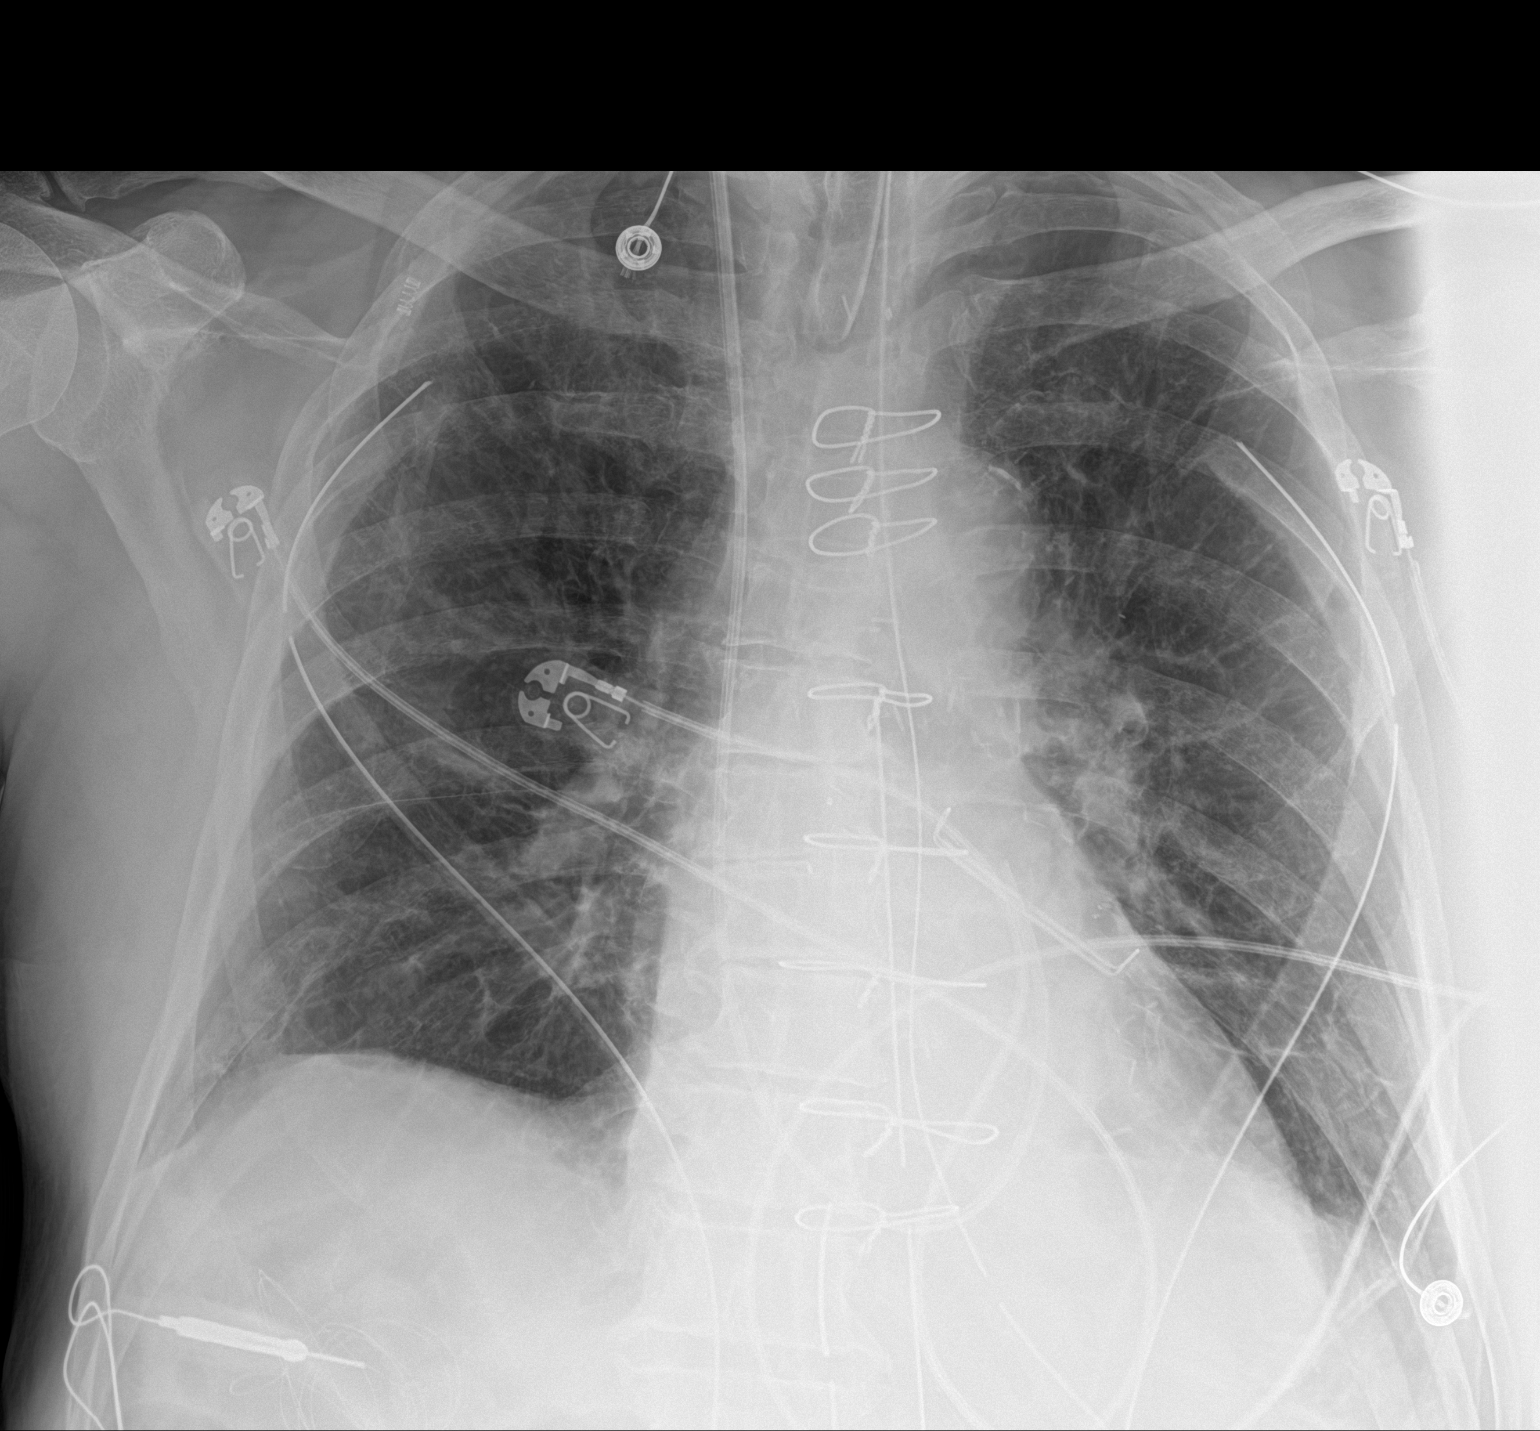

[1 of 1 positions shown; findings below may reference images not displayed]

FINDINGS: Endotracheal tube in good position. Swan-Ganz catheter tip in the
pulmonary outflow tract. NG tube in the stomach. Bilateral chest
tubes and mediastinal drains in good position. Left atrial appendage
clip.

Small apical pneumothoraces bilaterally, approximately 5%. Mild
atelectasis in the left lung base. Small right effusion
IMPRESSION: Postop CABG.  Tiny apical pneumothoraces bilaterally.

Mild left lower lobe atelectasis.

## 2018-11-15 IMAGING — DX DG CHEST 1V PORT
1 series · 1 of 1 positions shown · non-contrast
Comparison: 01/12/2017

CLINICAL DATA: Status post coronary bypass grafting

EXAM:
PORTABLE CHEST 1 VIEW

[chest ap]
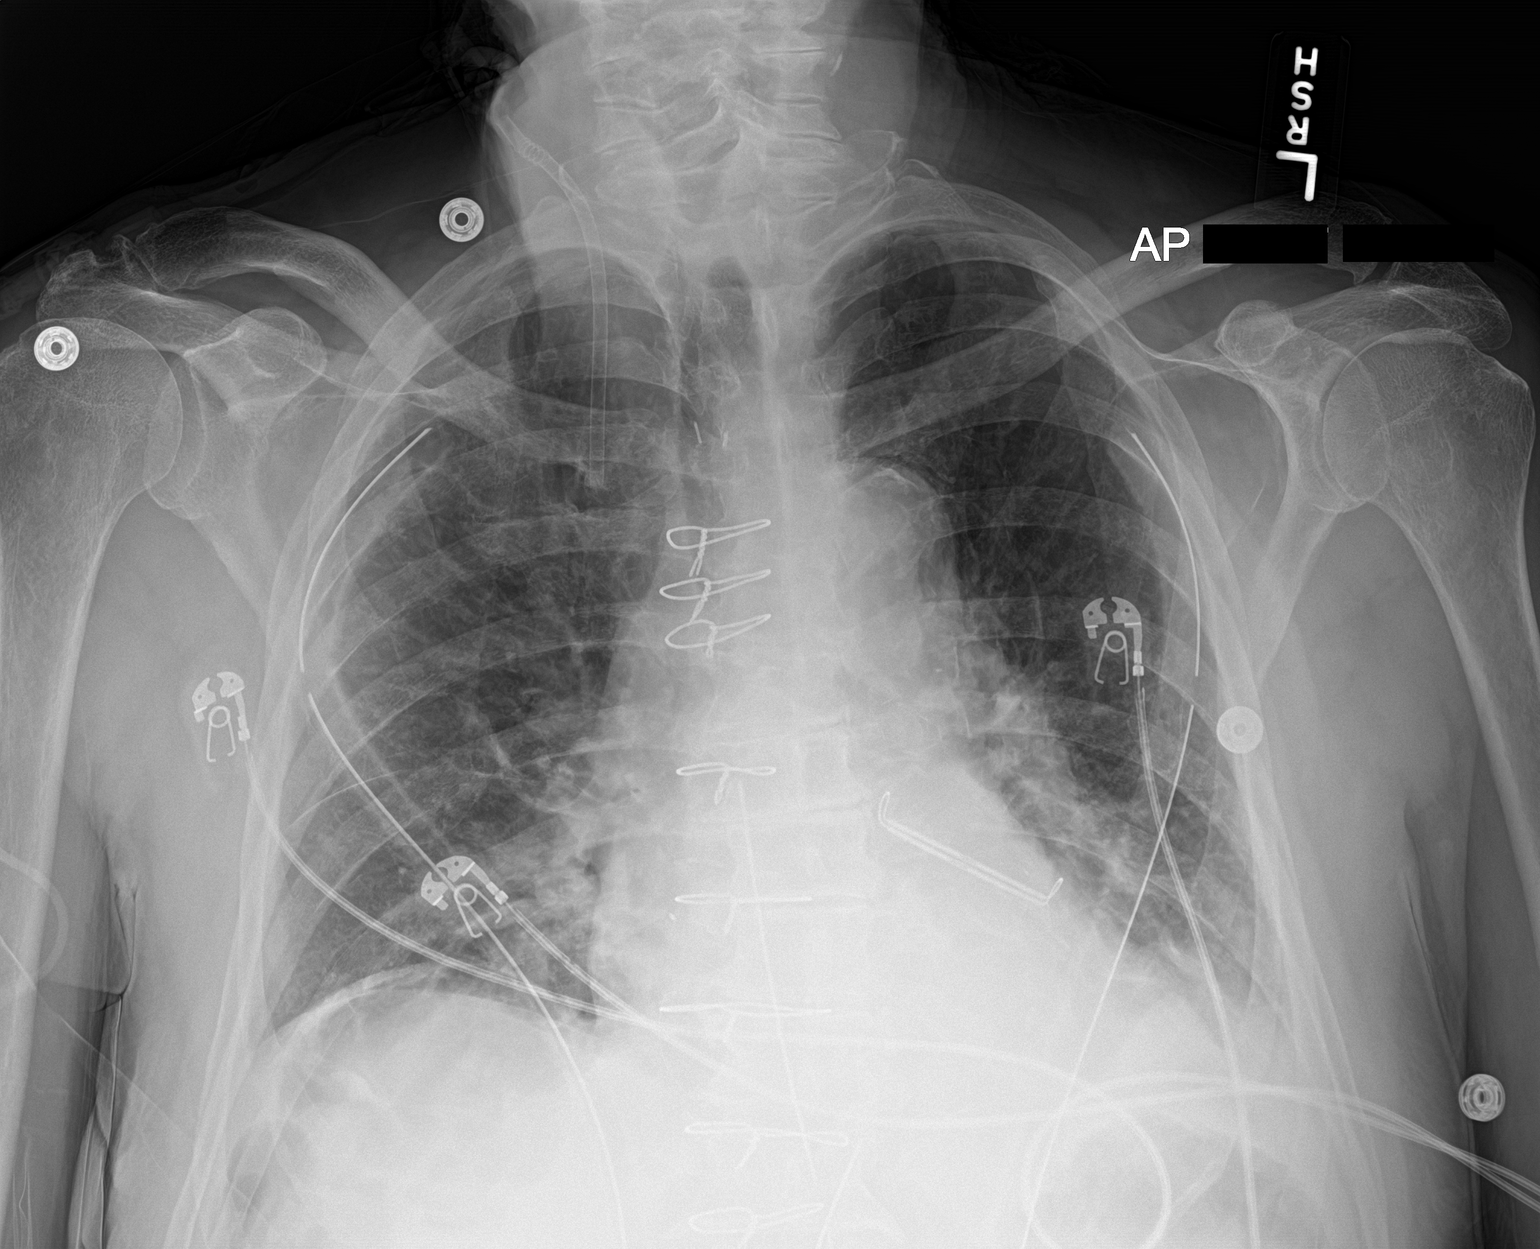

[1 of 1 positions shown; findings below may reference images not displayed]

FINDINGS: Cardiac shadow is stable. Postoperative changes are again seen. The
Swan-Ganz catheter has been removed in the interval although the
jugular sheath remains. Bilateral thoracostomy catheters and
mediastinal drain are seen. No significant pneumothorax is noted.
Patchy bibasilar atelectatic changes are noted left greater than
right.
IMPRESSION: Tubes and lines as described.

Slight increase in bibasilar atelectasis.

## 2018-11-16 IMAGING — DX DG CHEST 2V
2 series · 2 of 2 positions shown · non-contrast
Comparison: 01/13/2017

CLINICAL DATA: Status post CABG

EXAM:
CHEST  2 VIEW

[chest pa]
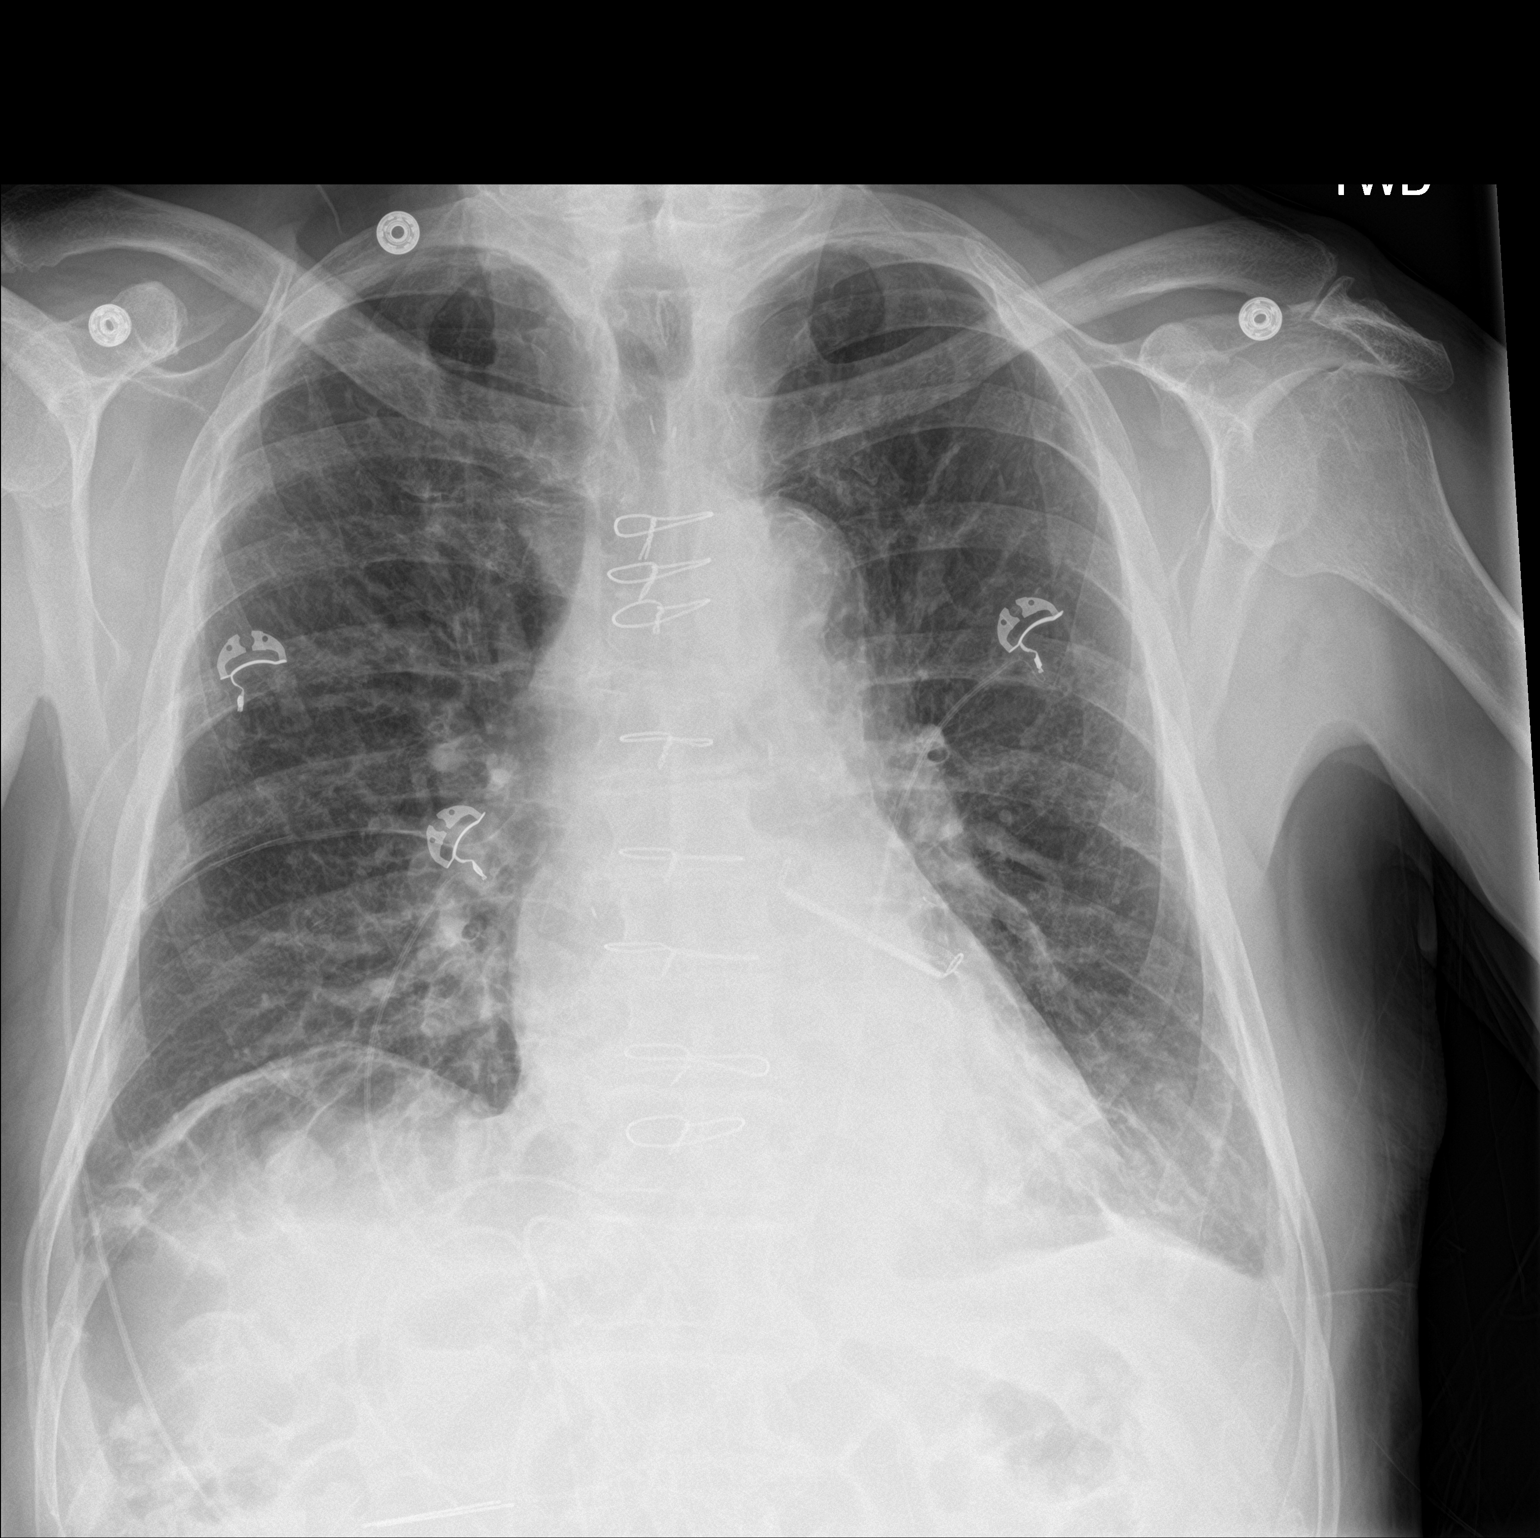

[chest lat]
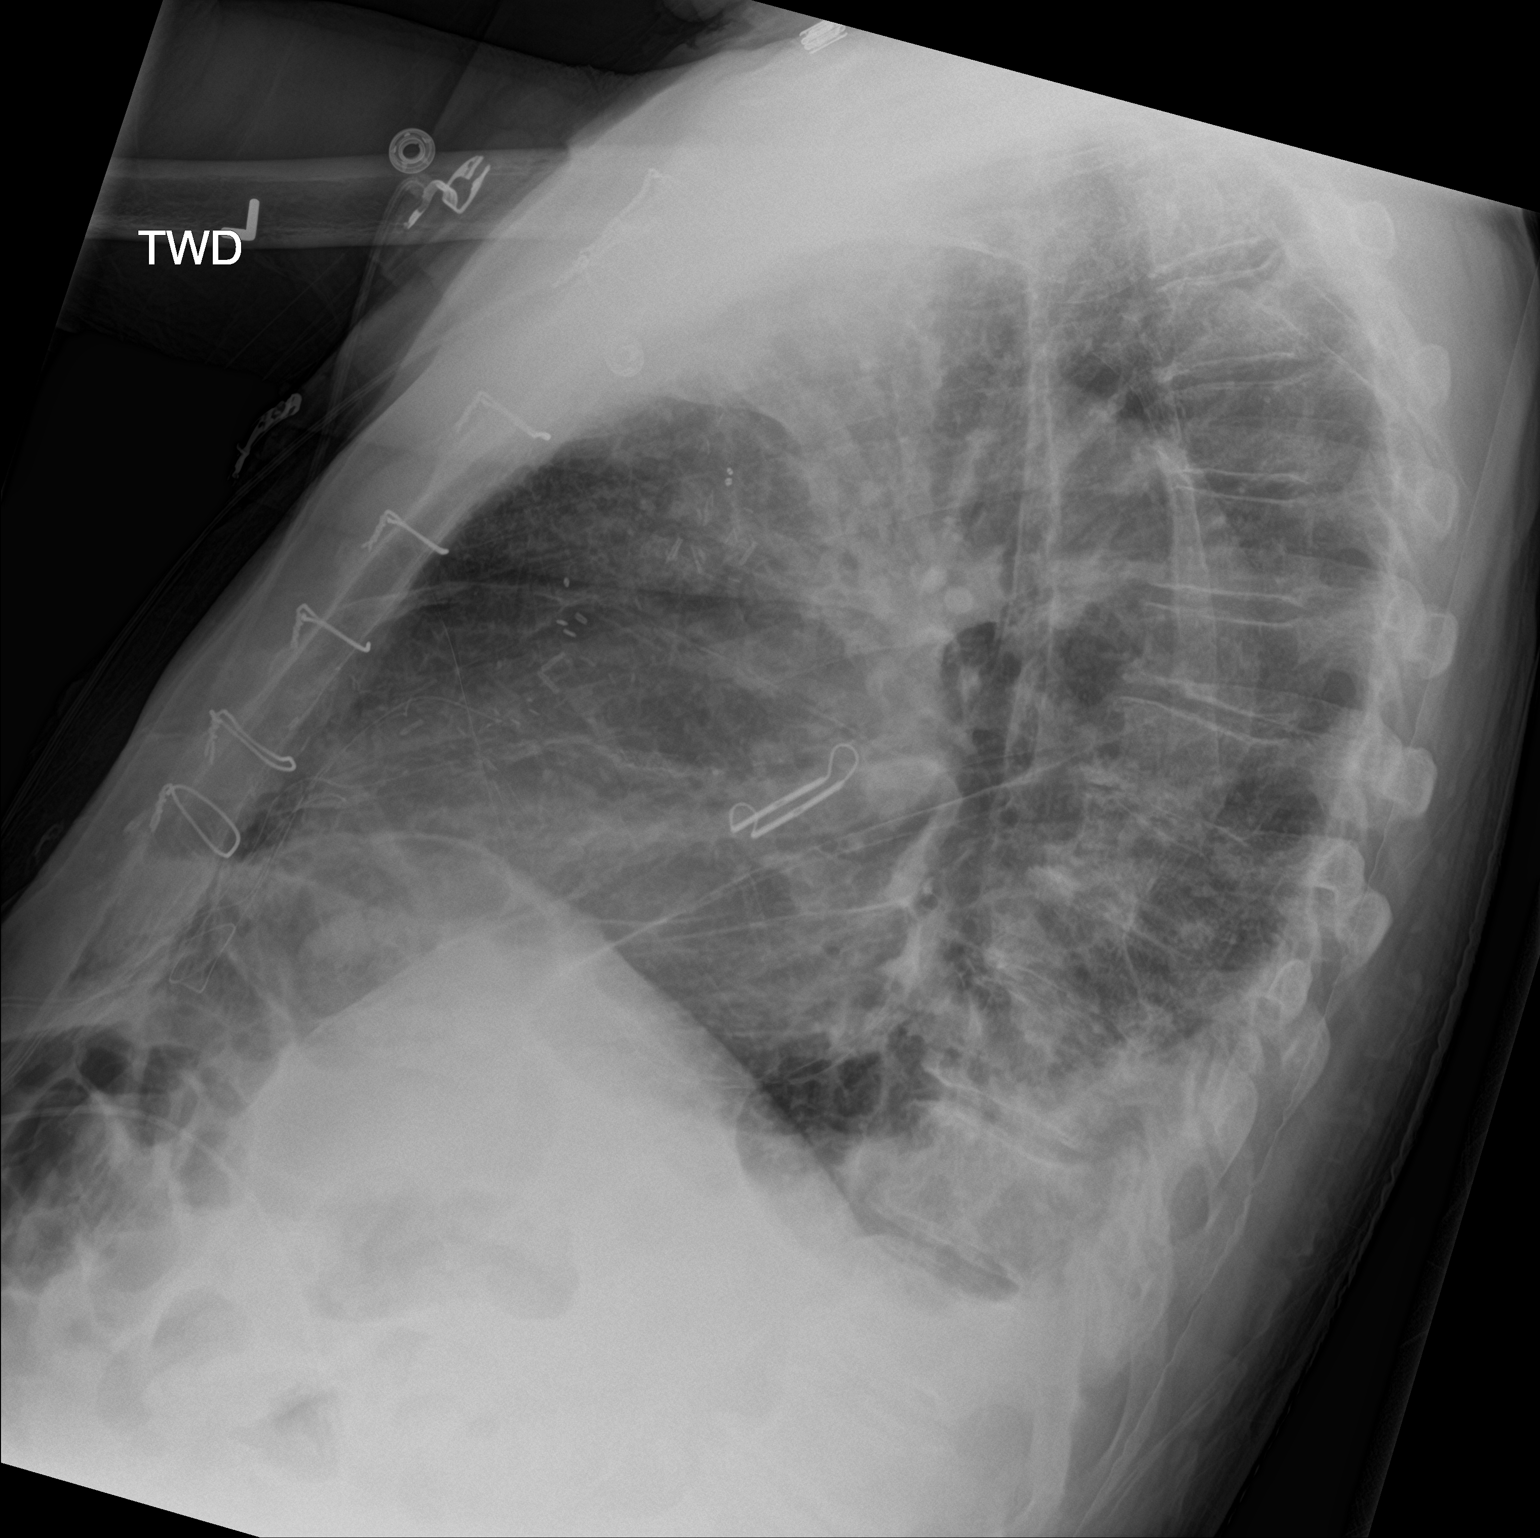

[2 of 2 positions shown; findings below may reference images not displayed]

FINDINGS: Normal heart size. Right jugular introducer sheath removed.
Bilateral chest tubes removed. No pneumothorax. Bibasilar volume
loss has improved. Mild vascular congestion is stable. No sign of
interstitial edema.
IMPRESSION: Chest tubes removed without pneumothorax.

Improving bibasilar volume loss.

No sign of CHF.
